# Patient Record
Sex: Female | Born: 1970 | Race: Black or African American | Hispanic: No | Marital: Single | Smoking: Never smoker
Health system: Southern US, Community
[De-identification: ages and names within clinical notes are randomized; demographics above are authoritative.]

## PROBLEM LIST (undated history)

## (undated) DIAGNOSIS — E538 Deficiency of other specified B group vitamins: Secondary | ICD-10-CM

## (undated) DIAGNOSIS — N6320 Unspecified lump in the left breast, unspecified quadrant: Secondary | ICD-10-CM

## (undated) DIAGNOSIS — R51 Headache: Secondary | ICD-10-CM

## (undated) DIAGNOSIS — D649 Anemia, unspecified: Secondary | ICD-10-CM

## (undated) DIAGNOSIS — K5190 Ulcerative colitis, unspecified, without complications: Secondary | ICD-10-CM

## (undated) DIAGNOSIS — R519 Headache, unspecified: Secondary | ICD-10-CM

## (undated) HISTORY — DX: Deficiency of other specified B group vitamins: E53.8

## (undated) HISTORY — DX: Anemia, unspecified: D64.9

## (undated) HISTORY — PX: BREAST LUMPECTOMY: SHX2

---

## 2008-07-03 ENCOUNTER — Ambulatory Visit (HOSPITAL_COMMUNITY): Admission: RE | Admit: 2008-07-03 | Discharge: 2008-07-03 | Payer: Self-pay | Admitting: Family Medicine

## 2009-04-19 ENCOUNTER — Inpatient Hospital Stay (HOSPITAL_COMMUNITY): Admission: AD | Admit: 2009-04-19 | Discharge: 2009-04-20 | Payer: Self-pay | Admitting: Obstetrics & Gynecology

## 2009-07-18 ENCOUNTER — Ambulatory Visit: Payer: Self-pay | Admitting: Oncology

## 2009-07-19 LAB — CBC WITH DIFFERENTIAL/PLATELET
Basophils Absolute: 0 10*3/uL (ref 0.0–0.1)
EOS%: 2.3 % (ref 0.0–7.0)
Eosinophils Absolute: 0.2 10*3/uL (ref 0.0–0.5)
HCT: 22.1 % — ABNORMAL LOW (ref 34.8–46.6)
HGB: 6.6 g/dL — CL (ref 11.6–15.9)
MCH: 21.3 pg — ABNORMAL LOW (ref 25.1–34.0)
MCV: 71.3 fL — ABNORMAL LOW (ref 79.5–101.0)
MONO%: 7.2 % (ref 0.0–14.0)
NEUT%: 75.8 % (ref 38.4–76.8)
lymph#: 1.3 10*3/uL (ref 0.9–3.3)

## 2009-07-23 LAB — IRON AND TIBC
Iron: 17 ug/dL — ABNORMAL LOW (ref 42–145)
TIBC: 432 ug/dL (ref 250–470)
UIBC: 415 ug/dL

## 2009-07-23 LAB — HEMOGLOBINOPATHY EVALUATION
Hemoglobin Other: 0 % (ref 0.0–0.0)
Hgb A2 Quant: 2.2 % (ref 2.2–3.2)

## 2009-07-23 LAB — COMPREHENSIVE METABOLIC PANEL
AST: 12 U/L (ref 0–37)
BUN: 8 mg/dL (ref 6–23)
Calcium: 8.4 mg/dL (ref 8.4–10.5)
Chloride: 106 mEq/L (ref 96–112)
Creatinine, Ser: 0.64 mg/dL (ref 0.40–1.20)
Glucose, Bld: 85 mg/dL (ref 70–99)

## 2009-08-06 ENCOUNTER — Ambulatory Visit (HOSPITAL_COMMUNITY): Admission: AD | Admit: 2009-08-06 | Discharge: 2009-08-06 | Payer: Self-pay | Admitting: Obstetrics and Gynecology

## 2009-08-13 ENCOUNTER — Ambulatory Visit (HOSPITAL_COMMUNITY): Admission: AD | Admit: 2009-08-13 | Discharge: 2009-08-13 | Payer: Self-pay | Admitting: Obstetrics and Gynecology

## 2009-08-20 ENCOUNTER — Ambulatory Visit (HOSPITAL_COMMUNITY): Admission: AD | Admit: 2009-08-20 | Discharge: 2009-08-20 | Payer: Self-pay | Admitting: Obstetrics and Gynecology

## 2009-08-27 ENCOUNTER — Ambulatory Visit (HOSPITAL_COMMUNITY): Admission: AD | Admit: 2009-08-27 | Discharge: 2009-08-27 | Payer: Self-pay | Admitting: Obstetrics and Gynecology

## 2009-10-10 ENCOUNTER — Ambulatory Visit (HOSPITAL_COMMUNITY): Admission: RE | Admit: 2009-10-10 | Discharge: 2009-10-10 | Payer: Self-pay | Admitting: Obstetrics and Gynecology

## 2009-10-14 ENCOUNTER — Ambulatory Visit: Payer: Self-pay | Admitting: Oncology

## 2009-10-16 LAB — CBC WITH DIFFERENTIAL/PLATELET
BASO%: 0.3 % (ref 0.0–2.0)
EOS%: 2.8 % (ref 0.0–7.0)
Eosinophils Absolute: 0.2 10*3/uL (ref 0.0–0.5)
MCHC: 33.5 g/dL (ref 31.5–36.0)
MCV: 83.8 fL (ref 79.5–101.0)
MONO#: 0.5 10*3/uL (ref 0.1–0.9)
MONO%: 7.9 % (ref 0.0–14.0)
Platelets: 257 10*3/uL (ref 145–400)
RBC: 3.65 10*6/uL — ABNORMAL LOW (ref 3.70–5.45)
RDW: 25.7 % — ABNORMAL HIGH (ref 11.2–14.5)
WBC: 6.4 10*3/uL (ref 3.9–10.3)

## 2009-10-16 LAB — IRON AND TIBC
%SAT: 12 % — ABNORMAL LOW (ref 20–55)
UIBC: 412 ug/dL

## 2009-11-01 ENCOUNTER — Inpatient Hospital Stay (HOSPITAL_COMMUNITY): Admission: RE | Admit: 2009-11-01 | Discharge: 2009-11-03 | Payer: Self-pay | Admitting: Obstetrics and Gynecology

## 2009-11-01 ENCOUNTER — Encounter (INDEPENDENT_AMBULATORY_CARE_PROVIDER_SITE_OTHER): Payer: Self-pay | Admitting: Obstetrics and Gynecology

## 2009-11-01 HISTORY — PX: TUBAL LIGATION: SHX77

## 2009-11-01 HISTORY — PX: LYSIS OF ADHESION: SHX5961

## 2009-12-16 ENCOUNTER — Ambulatory Visit: Payer: Self-pay | Admitting: Oncology

## 2009-12-18 LAB — COMPREHENSIVE METABOLIC PANEL
AST: 14 U/L (ref 0–37)
Albumin: 4.3 g/dL (ref 3.5–5.2)
Alkaline Phosphatase: 95 U/L (ref 39–117)
BUN: 13 mg/dL (ref 6–23)
Calcium: 9.8 mg/dL (ref 8.4–10.5)
Creatinine, Ser: 0.68 mg/dL (ref 0.40–1.20)
Potassium: 4.2 mEq/L (ref 3.5–5.3)
Total Bilirubin: 0.4 mg/dL (ref 0.3–1.2)
Total Protein: 7.7 g/dL (ref 6.0–8.3)

## 2009-12-18 LAB — CBC WITH DIFFERENTIAL/PLATELET
BASO%: 0.5 % (ref 0.0–2.0)
Basophils Absolute: 0 10*3/uL (ref 0.0–0.1)
EOS%: 4 % (ref 0.0–7.0)
LYMPH%: 33.1 % (ref 14.0–49.7)
MCH: 27.4 pg (ref 25.1–34.0)
MCHC: 33.1 g/dL (ref 31.5–36.0)
MCV: 82.8 fL (ref 79.5–101.0)
NEUT%: 53.4 % (ref 38.4–76.8)
Platelets: 319 10*3/uL (ref 145–400)
RBC: 4.26 10*6/uL (ref 3.70–5.45)
RDW: 17.2 % — ABNORMAL HIGH (ref 11.2–14.5)
WBC: 4.2 10*3/uL (ref 3.9–10.3)

## 2009-12-18 LAB — IRON AND TIBC
%SAT: 21 % (ref 20–55)
Iron: 75 ug/dL (ref 42–145)

## 2009-12-18 LAB — FERRITIN: Ferritin: 11 ng/mL (ref 10–291)

## 2010-08-21 ENCOUNTER — Emergency Department (HOSPITAL_COMMUNITY)
Admission: EM | Admit: 2010-08-21 | Discharge: 2010-08-21 | Payer: Self-pay | Source: Home / Self Care | Admitting: Emergency Medicine

## 2010-09-24 ENCOUNTER — Encounter (INDEPENDENT_AMBULATORY_CARE_PROVIDER_SITE_OTHER): Payer: Self-pay | Admitting: *Deleted

## 2010-10-09 NOTE — Letter (Signed)
Summary: New Patient letter  [facility]  [address] [place], [place] [address]   Phone: [phone]  Fax: [phone]       09/24/2010 MRN: [id number]  SHARNE LINDERS [address] [place], [place]  [address]  Dear [name],  Welcome to the Gastroenterology Division at [facility].    You are scheduled to see Dr.  Sheryn Bison on October 30, 2010 at 10:00am on the 3rd floor at [facility], [address]  We ask that you try to arrive at our office 15 minutes prior to your appointment time to allow for check-in.  We would like you to complete the enclosed self-administered evaluation form prior to your visit and bring it with you on the day of your appointment.  We will review it with you.  Also, please bring a complete list of all your medications or, if you prefer, bring the medication bottles and we will list them.  Please bring your insurance card so that we may make a copy of it.  If your insurance requires a referral to see a specialist, please bring your referral form from your primary care physician.  Co-payments are due at the time of your visit and may be paid by cash, check or credit card.     Your office visit will consist of a consult with your physician (includes a physical exam), any laboratory testing he/she may order, scheduling of any necessary diagnostic testing (e.g. x-ray, ultrasound, CT-scan), and scheduling of a procedure (e.g. Endoscopy, Colonoscopy) if required.  Please allow enough time on your schedule to allow for any/all of these possibilities.    If you cannot keep your appointment, please call [phone] to cancel or reschedule prior to your appointment date.  This allows [place] the opportunity to schedule an appointment for another patient in need of care.  If you do not cancel or reschedule by 5 p.m. the business day prior to your appointment date, you will be charged a $50.00 late cancellation/no-show fee.    Thank you  for choosing [facility] for your medical needs.  We appreciate the opportunity to care for you.  Please visit [place] at our website  to learn more about our practice.                     Sincerely,                                                             The Gastroenterology Division

## 2010-10-30 ENCOUNTER — Encounter: Payer: Self-pay | Admitting: Gastroenterology

## 2010-10-30 ENCOUNTER — Other Ambulatory Visit: Payer: Medicaid Other

## 2010-10-30 ENCOUNTER — Other Ambulatory Visit: Payer: Self-pay | Admitting: Gastroenterology

## 2010-10-30 ENCOUNTER — Ambulatory Visit (INDEPENDENT_AMBULATORY_CARE_PROVIDER_SITE_OTHER): Payer: Medicaid Other | Admitting: Gastroenterology

## 2010-10-30 ENCOUNTER — Encounter (INDEPENDENT_AMBULATORY_CARE_PROVIDER_SITE_OTHER): Payer: Self-pay | Admitting: *Deleted

## 2010-10-30 DIAGNOSIS — K625 Hemorrhage of anus and rectum: Secondary | ICD-10-CM

## 2010-10-30 DIAGNOSIS — K5150 Left sided colitis without complications: Secondary | ICD-10-CM

## 2010-10-30 DIAGNOSIS — E079 Disorder of thyroid, unspecified: Secondary | ICD-10-CM

## 2010-10-30 LAB — TSH: TSH: 0.28 u[IU]/mL — ABNORMAL LOW (ref 0.35–5.50)

## 2010-10-30 LAB — CBC WITH DIFFERENTIAL/PLATELET
Basophils Relative: 1.1 % (ref 0.0–3.0)
Eosinophils Absolute: 0.2 10*3/uL (ref 0.0–0.7)
Hemoglobin: 12 g/dL (ref 12.0–15.0)
MCHC: 33.8 g/dL (ref 30.0–36.0)
MCV: 90.4 fl (ref 78.0–100.0)
Monocytes Absolute: 0.3 10*3/uL (ref 0.1–1.0)
RBC: 3.91 Mil/uL (ref 3.87–5.11)
RDW: 12.5 % (ref 11.5–14.6)
WBC: 4.7 10*3/uL (ref 4.5–10.5)

## 2010-10-30 LAB — HEPATIC FUNCTION PANEL
ALT: 8 U/L (ref 0–35)
Albumin: 3.9 g/dL (ref 3.5–5.2)
Bilirubin, Direct: 0.1 mg/dL (ref 0.0–0.3)

## 2010-10-30 LAB — BASIC METABOLIC PANEL
BUN: 16 mg/dL (ref 6–23)
CO2: 29 mEq/L (ref 19–32)
Calcium: 9.6 mg/dL (ref 8.4–10.5)
Chloride: 105 mEq/L (ref 96–112)
Glucose, Bld: 127 mg/dL — ABNORMAL HIGH (ref 70–99)
Sodium: 139 mEq/L (ref 135–145)

## 2010-10-30 LAB — IBC PANEL
Saturation Ratios: 21.8 % (ref 20.0–50.0)
Transferrin: 259.2 mg/dL (ref 212.0–360.0)

## 2010-10-30 LAB — VITAMIN B12: Vitamin B-12: 225 pg/mL (ref 211–911)

## 2010-10-30 LAB — HIGH SENSITIVITY CRP: CRP, High Sensitivity: 12.13 mg/L — ABNORMAL HIGH (ref 0.00–5.00)

## 2010-10-31 ENCOUNTER — Encounter (INDEPENDENT_AMBULATORY_CARE_PROVIDER_SITE_OTHER): Payer: Medicaid Other

## 2010-10-31 ENCOUNTER — Encounter: Payer: Self-pay | Admitting: Gastroenterology

## 2010-10-31 DIAGNOSIS — E079 Disorder of thyroid, unspecified: Secondary | ICD-10-CM | POA: Insufficient documentation

## 2010-10-31 DIAGNOSIS — E538 Deficiency of other specified B group vitamins: Secondary | ICD-10-CM

## 2010-10-31 LAB — CONVERTED CEMR LAB: T4, Total: 7.7 ug/dL (ref 5.0–12.5)

## 2010-11-03 ENCOUNTER — Other Ambulatory Visit (AMBULATORY_SURGERY_CENTER): Payer: Medicaid Other | Admitting: Gastroenterology

## 2010-11-03 ENCOUNTER — Other Ambulatory Visit: Payer: Self-pay | Admitting: Gastroenterology

## 2010-11-03 DIAGNOSIS — R109 Unspecified abdominal pain: Secondary | ICD-10-CM

## 2010-11-03 DIAGNOSIS — R197 Diarrhea, unspecified: Secondary | ICD-10-CM

## 2010-11-03 DIAGNOSIS — K5289 Other specified noninfective gastroenteritis and colitis: Secondary | ICD-10-CM

## 2010-11-03 DIAGNOSIS — K625 Hemorrhage of anus and rectum: Secondary | ICD-10-CM

## 2010-11-04 ENCOUNTER — Encounter: Payer: Self-pay | Admitting: Gastroenterology

## 2010-11-04 NOTE — Assessment & Plan Note (Signed)
Summary: 1 of 3 b12 injections/lk  Nurse Visit   Allergies: No Known Drug Allergies  Medication Administration  Injection # 1:    Medication: Vit B12 1000 mcg    Diagnosis: VITAMIN B12 DEFICIENCY (ICD-266.2)    Route: IM    Site: L deltoid    Exp Date: 07/2012    Lot #: [id number]    Mfr: American Regent    Patient tolerated injection without complications    Given by: Merri Ray CMA Duncan Dull) (October 31, 2010 2:29 PM)  Orders Added: 1)  Vit B12 1000 mcg [J3420]

## 2010-11-04 NOTE — Assessment & Plan Note (Signed)
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 Pecola Leisure for evaluation of several years of lower abdominal cramping and bloody diarrhea. She has a 11-month-old infant, and is breast-feeding at this time, and had a menstrual period approximately 2-3 weeks ago. She denies upper gastrointestinal or hepatobiliary complaints. She has not had previous colonoscopy or barium studies.  There is no family history of inflammatory bowel disease, and the patient denies infectious disease exposure or foreign travel. She denies any specific food intolerances except to meats. There is no history of fever, chills, skin rashes, joint pains, or oral stomatitis. She recently was found to have a guaiac positive stool but was not anemic. There is no history of use of alcohol, cigarettes, or NSAIDs.   GI Review of Systems    Reports abdominal pain, bloating, nausea, and  vomiting.     Location of  Abdominal pain: lower abdomen.    Denies acid reflux, belching, chest pain, dysphagia with liquids, dysphagia with solids, heartburn, loss of appetite, vomiting blood, weight loss, and  weight gain.      Reports change in bowel habits, diarrhea, hemorrhoids, irritable bowel syndrome, rectal bleeding, and  rectal pain.     Denies anal fissure, black tarry stools, constipation, diverticulosis, fecal incontinence, heme positive stool, jaundice, light color stool, and  liver problems. Preventive Screening-Counseling & Management  Alcohol-Tobacco     Smoking Status: never      Drug Use:  no.      Current Medications (verified): 1)  None  Allergies (verified): No  Known Drug Allergies  Past History:  Past medical, surgical, family and social histories (including risk factors) reviewed for relevance to current acute and chronic problems.  Past Medical History: Anemia Irritable Bowel Syndrome 2009 Pneumonia 2010  Past Surgical History: C- section  x3  Family History: Reviewed history and no changes required. No FH of Colon Cancer:  Social History: Reviewed history and no changes required. 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  Alcohol Use - no Illicit Drug Use - no Smoking Status:  never Drug Use:  no  Review of Systems       The patient complains of headaches-new.  The patient denies allergy/sinus, anemia, anxiety-new, arthritis/joint pain, back pain, blood in urine, breast changes/lumps, change in vision, confusion, cough, coughing up blood, depression-new, fainting, fatigue, fever, hearing problems, heart murmur, heart rhythm changes, itching, menstrual pain, muscle pains/cramps, night sweats, nosebleeds, pregnancy symptoms, shortness of breath, skin rash, sleeping problems, sore throat, swelling of feet/legs, swollen lymph glands, thirst - excessive , urination - excessive , urination changes/pain, urine leakage, vision changes, and voice change.         She does take Excedrin Migraine tablets p.r.n.  Vital Signs:  Patient profile:   40 year old female Height:      63 inches Weight:      132.38 pounds BMI:     23.53 Pulse rate:   64 / minute Pulse rhythm:   regular BP sitting:   86 / 60  (left arm) Cuff size:   regular  Vitals Entered By: June McMurray CMA Duncan Dull) (October 30, 2010 10:05 AM)  Physical Exam  General:  Well developed, well nourished, no acute distress.healthy appearing.   Head:  Normocephalic and atraumatic. Eyes:  PERRLA, no icterus.exam deferred to patient's ophthalmologist.   Neck:  Supple; no masses or thyromegaly. Lungs:  Clear throughout to auscultation. Heart:  Regular rate and rhythm; no  murmurs, rubs,  or bruits. Abdomen:  Soft, nontender and nondistended. No masses, hepatosplenomegaly or hernias noted. Normal bowel sounds. Rectal:  very loose yellowish stool which is markedly guaiac positive. Msk:  Symmetrical with no gross deformities. Normal posture. Pulses:  Normal pulses noted. Extremities:  No clubbing, cyanosis, edema or deformities noted. Neurologic:  Alert and  oriented x4;  grossly normal neurologically. Cervical Nodes:  No significant cervical adenopathy. Psych:  Alert and cooperative. Normal mood and affect.   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 Labs are been ordered also stool exams to exclude enteric pathogens. I have started her on Lialda 2.4 g twice a day as tolerated with a low fiber diet. Colonoscopy also scheduled at her convenience. I have advised her that she will need to suspend breast-feeding for at least 12 hours after her colonoscopy because of the narcotics and benzodiazepine we used for sedation. Orders: TLB-CBC Platelet - w/Differential (85025-CBCD) TLB-BMP (Basic Metabolic Panel-BMET) (80048-METABOL) TLB-Hepatic/Liver Function Pnl (80076-HEPATIC) TLB-TSH (Thyroid Stimulating Hormone) (84443-TSH) TLB-Ferritin (82728-FER) TLB-B12, Serum-Total ONLY (27253-G64) TLB-Folic Acid (Folate) (82746-FOL) TLB-IBC Pnl (Iron/FE;Transferrin) (83550-IBC) TLB-CRP-High Sensitivity (C-Reactive Protein) (86140-FCRP) TLB-Sedimentation Rate (ESR) (85652-ESR) T-Culture, Stool (87045/87046-70140) T-Culture, C-Diff Toxin A/B (40347-42595) T-Stool Giardia / Crypto- EIA (63875) T-Stool for O&P (64332-95188) T-Fecal WBC (41660-63016) Colonoscopy (Colon)  Problem # 2:  RECTAL BLEEDING (ICD-569.3) Assessment: Unchanged  Orders: TLB-CBC Platelet - w/Differential (85025-CBCD) TLB-BMP (Basic Metabolic Panel-BMET) (80048-METABOL) TLB-Hepatic/Liver  Function Pnl (80076-HEPATIC) TLB-TSH (Thyroid Stimulating Hormone) (84443-TSH) TLB-Ferritin (82728-FER) TLB-B12, Serum-Total ONLY (01093-A35) TLB-Folic Acid (Folate) (82746-FOL) TLB-IBC Pnl (Iron/FE;Transferrin) (83550-IBC) TLB-CRP-High Sensitivity (C-Reactive Protein) (86140-FCRP) TLB-Sedimentation Rate (ESR) (85652-ESR) T-Culture, Stool (87045/87046-70140) T-Culture, C-Diff Toxin A/B (57322-02542) T-Stool Giardia / Crypto- EIA (70623) T-Stool for O&P (76283-15176) T-Fecal WBC (16073-71062) Colonoscopy (Colon)  Patient Instructions: 1)  Copy sent to : Leilani Able, MD 2)  Advised to stick with a low residue diet  avoiding food that can irritate bowel (see handout).  3)  Please go to the basement today for your labs.  4)  Your procedure has been scheduled for 11/03/2010, please follow the seperate instructions.  5)  [facility] Patient Information Guide given to patient.  6)  Colonoscopy and Flexible Sigmoidoscopy brochure given.  7)  The medication list was reviewed and reconciled.  All changed / newly prescribed medications were explained.  A complete medication list was provided to the patient / caregiver. Prescriptions: MOVIPREP 100 GM  SOLR (PEG-KCL-NACL-NASULF-NA ASC-C) As per prep instructions.  #1 x 0   Entered by:   Harlow Mares CMA (AAMA)   Authorized by:   Mardella Layman MD [facility]   Signed by:   Harlow Mares CMA (AAMA) on 10/30/2010   Method used:   Electronically to        [facility] [phone]* (retail)       [address]       [place], [place]  [address]       Ph: [phone] or [phone]       Fax: [phone]   RxID:   [id number] LIALDA 1.2 GM TBEC (MESALAMINE) take two by mouth two times a day  #120 x 1   Entered  by:   Harlow Mares CMA (AAMA)   Authorized by:   Mardella Layman MD [facility]   Signed by:   Harlow Mares CMA (AAMA) on 10/30/2010   Method used:   [facility]ron[facility] to        [facility] [phone]* (retail)       [address]        [place], [place]  [address]       Ph: [phone] or [phone]       Fax: [phone]   RxID:   [id number]

## 2010-11-04 NOTE — Letter (Signed)
Summary: [facility] Instructions  [facility]  [address] [place], [place] [address]   Phone: [phone]  Fax: [phone]       Kim Hurst    1970/11/19    MRN: [id number]        Procedure Day /Date: Monday 11/03/2010     Arrival Time: 3pm     Procedure Time: 4pm     Location of Procedure:                    [facility] (4th Floor)   PREPARATION FOR COLONOSCOPY WITH MOVIPREP   Starting 5 days prior to your procedure TODAY do not eat nuts, seeds, popcorn, corn, beans, peas,  salads, or any raw vegetables.  Do not take any fiber supplements (e.g. Metamucil, Citrucel, and Benefiber).  THE DAY BEFORE YOUR PROCEDURE        Sunday 11/02/2010  1.  Drink clear liquids the entire day-NO SOLID FOOD  2.  Do not drink anything colored red or purple.  Avoid juices with pulp.  No orange juice.  3.  Drink at least 64 oz. (8 glasses) of fluid/clear liquids during the day to prevent dehydration and help the prep work efficiently.  CLEAR LIQUIDS INCLUDE: Water Jello Ice Popsicles Tea (sugar ok, no milk/cream) Powdered fruit flavored drinks Coffee (sugar ok, no milk/cream) Gatorade Juice: apple, white grape, white cranberry  Lemonade Clear bullion, consomm, broth Carbonated beverages (any kind) Strained chicken noodle soup Hard Candy                             4.  In the morning, mix first dose of MoviPrep solution:    Empty 1 Pouch A and 1 Pouch B into the disposable container    Add lukewarm drinking water to the top line of the container. Mix to dissolve    Refrigerate (mixed solution should be used within 24 hrs)  5.  Begin drinking the prep at 5:00 p.m. The MoviPrep container is divided by 4 marks.   Every 15 minutes drink the solution down to the next mark (approximately 8 oz) until the full liter is complete.   6.  Follow completed prep with 16 oz of clear liquid of your choice (Nothing red or purple).  Continue to drink clear  liquids until bedtime.  7.  Before going to bed, mix second dose of MoviPrep solution:    Empty 1 Pouch A and 1 Pouch B into the disposable container    Add lukewarm drinking water to the top line of the container. Mix to dissolve    Refrigerate  THE DAY OF YOUR PROCEDURE      Monday 11/03/2010  Beginning at 11:00am (5 hours before procedure):         1. Every 15 minutes, drink the solution down to the next mark (approx 8 oz) until the full liter is complete.  2. Follow completed prep with 16 oz. of clear liquid of your choice.    3. You may drink clear liquids until 2:00pm (2 HOURS BEFORE PROCEDURE).   MEDICATION INSTRUCTIONS  Unless otherwise instructed, you should take regular prescription medications with a small sip of water   as early as possible the morning of your procedure.           OTHER INSTRUCTIONS  You will need a responsible adult at least 42 years of age to accompany you and drive  you home.   This person must remain in the waiting room during your procedure.  Wear loose fitting clothing that is easily removed.  Leave jewelry and other valuables at home.  However, you may wish to bring a book to read or  an iPod/MP3 player to listen to music as you wait for your procedure to start.  Remove all body piercing jewelry and leave at home.  Total time from sign-in until discharge is approximately 2-3 hours.  You should go home directly after your procedure and rest.  You can resume normal activities the  day after your procedure.  The day of your procedure you should not:   Drive   Make legal decisions   Operate machinery   Drink alcohol   Return to work  You will receive specific instructions about eating, activities and medications before you leave.    The above instructions have been reviewed and explained to me by   _______________________    I fully understand and can verbalize these instructions _____________________________ Date  _________

## 2010-11-07 ENCOUNTER — Encounter (INDEPENDENT_AMBULATORY_CARE_PROVIDER_SITE_OTHER): Payer: Medicaid Other

## 2010-11-07 ENCOUNTER — Encounter: Payer: Self-pay | Admitting: Gastroenterology

## 2010-11-07 DIAGNOSIS — E538 Deficiency of other specified B group vitamins: Secondary | ICD-10-CM

## 2010-11-11 ENCOUNTER — Encounter: Payer: Self-pay | Admitting: Gastroenterology

## 2010-11-13 ENCOUNTER — Encounter: Payer: Self-pay | Admitting: Gastroenterology

## 2010-11-13 NOTE — Assessment & Plan Note (Signed)
Summary: B12 INJECTIONSCH'D W/PT  Nurse Visit   Allergies: No Known Drug Allergies  Medication Administration  Injection # 1:    Medication: Vit B12 1000 mcg    Diagnosis: VITAMIN B12 DEFICIENCY (ICD-266.2)    Route: IM    Site: L deltoid    Exp Date: 11/13    Lot #: [id number]    Mfr: American Regent    Patient tolerated injection without complications    Given by: Lamona Curl CMA (AAMA) (November 07, 2010 2:13 PM)  Orders Added: 1)  Vit B12 1000 mcg [J3420]

## 2010-11-13 NOTE — Procedures (Addendum)
Summary: Colonoscopy  Patient: Kim Hurst Note: All result statuses are Final unless otherwise noted.  Tests: (1) Colonoscopy (COL)   COL Colonoscopy           DONE     [facility]     [address]     [place], [place]  [address]          COLONOSCOPY PROCEDURE REPORT          PATIENT:  Kim Hurst, Kim Hurst  MR#:  [id number]     BIRTHDATE:  February 13, 1971, 40 yrs. old  GENDER:  female     ENDOSCOPIST:  Vania Rea. Jarold Motto, MD, [facility]     REF. BY:  Leilani Able, M.D.     PROCEDURE DATE:  11/03/2010     PROCEDURE:  Colonoscopy with biopsy     ASA CLASS:  Class I     INDICATIONS:  Abdominal pain, unexplained diarrhea, rectal     bleeding     MEDICATIONS:   Fentanyl 100 mcg IV, Versed 10 mg IV          DESCRIPTION OF PROCEDURE:   After the risks benefits and     alternatives of the procedure were thoroughly explained, informed     consent was obtained.  Digital rectal exam was performed and     revealed tender.   The LB 180AL [id number] endoscope was introduced     through the anus and advanced to the cecum, which was identified     by both the appendix and ileocecal valve, without limitations.     The quality of the prep was excellent, using MoviPrep.  The     instrument was then slowly withdrawn as the colon was fully     examined.<<PROCEDUREIMAGES>>          FINDINGS:  Colitis was found in the rectum and sigmoid colon. 0-15     CM GRANULAR AND ERODED MUCOSA BIOPSIED.SEE PICTURES  This was     otherwise a normal examination of the colon.   Retroflexed views     in the rectum revealed it was not tolerated by the patient.    The     scope was then withdrawn from the patient and the procedure     completed.          COMPLICATIONS:  None     ENDOSCOPIC IMPRESSION:     1) Colitis in the rectum and sigmoid colon     2) Otherwise normal examination     PROCTOSIGMOIDITIS.ALL STOOL EXAMS AND C.DIFF TOXIN ASSAY ARE     NEGATIVE.     RECOMMENDATIONS:     1.CONTINUE LIALDA 2.4 GM  DAY     2.CANASA 1 GM SUPP QHS     3.OV 1 MONTH     REPEAT EXAM:  No          ______________________________     Vania Rea. Jarold Motto, MD, [facility]          CC:          n.     eSIGNED:   Vania Rea. Robyn Nohr at 11/03/2010 05:10 PM          Daryll Drown, [id number]  Note: An exclamation mark (!) indicates a result that was not dispersed into the flowsheet. Document Creation Date: 11/03/2010 5:10 PM _______________________________________________________________________  (1) Order result status: Final Collection or observation date-time: 11/03/2010 17:01 Requested date-time:  Receipt date-time:  Reported date-time:  Referring Physician:   Ordering Physician: Sheryn Bison (  [id number]) Specimen Source:  Source: Launa Grill Order Number: [id number] Lab site:

## 2010-11-13 NOTE — Letter (Signed)
Summary: Appt Reminder 2  [facility]  [address] [place], [place] [address]   Phone: [phone]  Fax: [phone]        November 04, 2010 MRN: [id number]    LENNY BOUCHILLON [address] [place], [place]  [address]    Dear Ms. DOKE,   You have a return appointment with Dr.Patterson on March 27,2012 at 2:45pm.  Please remember to bring a complete list of the medicines you are taking, your insurance card and your co-pay.  If you have to cancel or reschedule this appointment, please call before 5:00 pm the evening before to avoid a cancellation fee.  If you have any questions or concerns, please call [phone].    Sincerely,    Graciella Freer RN

## 2010-11-14 ENCOUNTER — Encounter: Payer: Self-pay | Admitting: Gastroenterology

## 2010-11-14 ENCOUNTER — Encounter (INDEPENDENT_AMBULATORY_CARE_PROVIDER_SITE_OTHER): Payer: Medicaid Other

## 2010-11-14 DIAGNOSIS — E538 Deficiency of other specified B group vitamins: Secondary | ICD-10-CM

## 2010-11-17 LAB — URINE MICROSCOPIC-ADD ON

## 2010-11-17 LAB — URINALYSIS, ROUTINE W REFLEX MICROSCOPIC
Bilirubin Urine: NEGATIVE
Glucose, UA: NEGATIVE mg/dL
Leukocytes, UA: NEGATIVE
Protein, ur: 30 mg/dL — AB
Specific Gravity, Urine: 1.041 — ABNORMAL HIGH (ref 1.005–1.030)
Urobilinogen, UA: 0.2 mg/dL (ref 0.0–1.0)
pH: 6 (ref 5.0–8.0)

## 2010-11-17 LAB — DIFFERENTIAL
Basophils Absolute: 0 10*3/uL (ref 0.0–0.1)
Basophils Relative: 1 % (ref 0–1)
Eosinophils Absolute: 0.1 10*3/uL (ref 0.0–0.7)
Eosinophils Relative: 2 % (ref 0–5)
Monocytes Absolute: 0.8 10*3/uL (ref 0.1–1.0)
Monocytes Relative: 15 % — ABNORMAL HIGH (ref 3–12)
Neutrophils Relative %: 56 % (ref 43–77)

## 2010-11-17 LAB — COMPREHENSIVE METABOLIC PANEL
ALT: 17 U/L (ref 0–35)
Albumin: 4.3 g/dL (ref 3.5–5.2)
Calcium: 9.6 mg/dL (ref 8.4–10.5)
Chloride: 102 mEq/L (ref 96–112)
GFR calc Af Amer: >60 mL/min (ref >60)
Potassium: 3.7 mEq/L (ref 3.5–5.1)
Total Protein: 8.7 g/dL — ABNORMAL HIGH (ref 6.0–8.3)

## 2010-11-17 LAB — PROTIME-INR: INR: 0.97 (ref 0.00–1.49)

## 2010-11-17 LAB — CBC
MCHC: 33.3 g/dL (ref 30.0–36.0)
Platelets: 362 10*3/uL (ref 150–400)
RDW: 12.6 % (ref 11.5–15.5)
WBC: 5.1 10*3/uL (ref 4.0–10.5)

## 2010-11-17 LAB — TYPE AND SCREEN
ABO/RH(D): O POS
Antibody Screen: NEGATIVE

## 2010-11-18 NOTE — Miscellaneous (Signed)
Summary: Change drugs  Changed Lialda Azulfidine due to insurance  Clinical Lists Changes  Medications: Changed medication from LIALDA 1.2 GM TBEC (MESALAMINE) take two by mouth two times a day to AZULFIDINE 500 MG TABS (SULFASALAZINE) take two tabs by mouth two times a day - Signed Rx of AZULFIDINE 500 MG TABS (SULFASALAZINE) take two tabs by mouth two times a day;  #120 x 6;  Signed;  Entered by: Harlow Mares CMA (AAMA);  Authorized by: Mardella Layman MD [facility];  Method used: [facility] [phone]*, [address], [place], [place]  [address], Ph: [phone] or [phone], Fax: [phone]    Prescriptions: AZULFIDINE 500 MG TABS (SULFASALAZINE) take two tabs by mouth two times a day  #120 x 6   Entered by:   Harlow Mares CMA (AAMA)   Authorized by:   Mardella Layman MD [facility]   Signed by:   Harlow Mares CMA (AAMA) on 11/13/2010   Method used:   [facility]ron[facility] to        [facility] [phone]* (retail)       [address]       [place], [place]  [address]       Ph: [phone] or [phone]       Fax: [phone]   RxID:   [id number]

## 2010-11-18 NOTE — Letter (Signed)
Summary: Patient Notice- Colon Biospy Results  [facility]  [address] [place], [place] [address]   Phone: [phone]  Fax: [phone]        November 11, 2010 MRN: [id number]    Kim Hurst [address] [place], [place]  [address]    Dear [name],  I am pleased to inform you that the biopsies taken during your recent colonoscopy did not show any evidence of cancer upon pathologic examination.  Additional information/recommendations:  __No further action is needed at this time.  Please follow-up with      your primary care physician for your other healthcare needs.  __Please call [phone] to schedule a return visit to review      your condition.  xx__Continue with the treatment plan as outlined on the day of your      exam.  __You should have a repeat colonoscopy examination for this problem           in _ years.  Please call us if you are having persistent problems or have questions about your condition that have not been fully answered at this time.  Sincerely,  Mardella Layman MD [facility]   This letter has been electronically signed by your physician.

## 2010-11-18 NOTE — Assessment & Plan Note (Signed)
Summary: b12 shot  Nurse Visit   Allergies: No Known Drug Allergies  Medication Administration  Injection # 1:    Medication: Vit B12 1000 mcg    Diagnosis: VITAMIN B12 DEFICIENCY (ICD-266.2)    Route: IM    Site: L deltoid    Exp Date: 07/2012    Lot #: [id number]    Mfr: American Regent    Patient tolerated injection without complications    Given by: Harlow Mares CMA (AAMA) (November 14, 2010 1:54 PM)  Orders Added: 1)  Vit B12 1000 mcg [J3420]

## 2010-11-26 LAB — CCBB MATERNAL DONOR DRAW

## 2010-11-26 LAB — CBC
HCT: 28.8 % — ABNORMAL LOW (ref 36.0–46.0)
MCHC: 33.1 g/dL (ref 30.0–36.0)
MCV: 83.7 fL (ref 78.0–100.0)
Platelets: 223 10*3/uL (ref 150–400)
Platelets: 264 10*3/uL (ref 150–400)
RBC: 3.44 MIL/uL — ABNORMAL LOW (ref 3.87–5.11)
WBC: 6 10*3/uL (ref 4.0–10.5)
WBC: 8.6 10*3/uL (ref 4.0–10.5)

## 2010-11-26 LAB — URINALYSIS, ROUTINE W REFLEX MICROSCOPIC
Glucose, UA: NEGATIVE mg/dL
Nitrite: NEGATIVE
Protein, ur: NEGATIVE mg/dL
Specific Gravity, Urine: 1.025 (ref 1.005–1.030)
Urobilinogen, UA: 0.2 mg/dL (ref 0.0–1.0)

## 2010-11-26 LAB — HEMOGLOBIN AND HEMATOCRIT, BLOOD
HCT: 22.2 % — ABNORMAL LOW (ref 36.0–46.0)
Hemoglobin: 7.3 g/dL — ABNORMAL LOW (ref 12.0–15.0)

## 2010-11-26 LAB — RPR: RPR Ser Ql: NONREACTIVE

## 2010-12-02 ENCOUNTER — Encounter: Payer: Self-pay | Admitting: Gastroenterology

## 2010-12-02 ENCOUNTER — Ambulatory Visit (INDEPENDENT_AMBULATORY_CARE_PROVIDER_SITE_OTHER): Payer: Medicaid Other | Admitting: Gastroenterology

## 2010-12-02 ENCOUNTER — Other Ambulatory Visit (INDEPENDENT_AMBULATORY_CARE_PROVIDER_SITE_OTHER): Payer: Medicaid Other

## 2010-12-02 VITALS — BP 100/62 | HR 72 | Wt 135.8 lb

## 2010-12-02 DIAGNOSIS — K5150 Left sided colitis without complications: Secondary | ICD-10-CM

## 2010-12-02 DIAGNOSIS — E538 Deficiency of other specified B group vitamins: Secondary | ICD-10-CM

## 2010-12-02 DIAGNOSIS — K625 Hemorrhage of anus and rectum: Secondary | ICD-10-CM

## 2010-12-02 LAB — TSH: TSH: 0.32 u[IU]/mL — ABNORMAL LOW (ref 0.35–5.50)

## 2010-12-02 NOTE — Assessment & Plan Note (Signed)
We will continue B12 replacement therapy as per standard protocol.

## 2010-12-02 NOTE — Patient Instructions (Signed)
Please go to the basement today for your labs.   

## 2010-12-02 NOTE — Progress Notes (Deleted)
This is a    Pertinent Review of Systems Negative   Physical Exam:    Assessment and Plan:

## 2010-12-02 NOTE — Progress Notes (Signed)
This is a  40 year old African American a female who had diarrhea , rectal bleeding, colonoscopy with confirmed left-sided ulcerative colitis. He was breast-feeding her infant and took 3 weeks of Azulfidine 1 g twice a day this self discontinued this medication, and is currently asymptomatic. Review of her labs showed normal labs except for slightly depressed TSH level   Pertinent Review of Systems Negative .Marland Kitchen No symptoms consistent with hyperthyroidism. Nausea upper gastrointestinal or hepatobiliary complaints.       Assessment and Plan: left-sided ulcerative colitis with negative exam for C. Difficile and other pathogens. Currently is asymptomatic and resolved therapy. She is to call if she has relapse of her problems and we will reinstitute aminosalicylate therapy as tolerated. It is of note that the patient continues to breast-feed her infant child. Complete set of thyroid function test ordered today.

## 2010-12-02 NOTE — Assessment & Plan Note (Signed)
This is completely resolved with therapy.

## 2010-12-03 ENCOUNTER — Telehealth: Payer: Self-pay | Admitting: *Deleted

## 2010-12-03 NOTE — Telephone Encounter (Signed)
Message copied by Harlow Mares on Wed Dec 03, 2010  8:39 AM ------      Message from: Jarold Motto, DAVID      Created: Wed Dec 03, 2010  8:25 AM       Thyroid normal

## 2010-12-03 NOTE — Telephone Encounter (Signed)
Left a message on patients machine to call back if she has questions all labs are normal.

## 2010-12-14 LAB — URINALYSIS, ROUTINE W REFLEX MICROSCOPIC
Glucose, UA: NEGATIVE mg/dL
Hgb urine dipstick: NEGATIVE
Ketones, ur: NEGATIVE mg/dL
Protein, ur: NEGATIVE mg/dL

## 2010-12-15 ENCOUNTER — Ambulatory Visit (INDEPENDENT_AMBULATORY_CARE_PROVIDER_SITE_OTHER): Payer: Medicaid Other | Admitting: Gastroenterology

## 2010-12-15 DIAGNOSIS — E538 Deficiency of other specified B group vitamins: Secondary | ICD-10-CM

## 2010-12-15 MED ORDER — CYANOCOBALAMIN 1000 MCG/ML IJ SOLN
1000.0000 ug | INTRAMUSCULAR | Status: DC
  Administered 2010-12-15 – 2011-02-16 (×3): 1000 ug via INTRAMUSCULAR

## 2011-01-15 ENCOUNTER — Ambulatory Visit (INDEPENDENT_AMBULATORY_CARE_PROVIDER_SITE_OTHER): Payer: Medicaid Other | Admitting: Gastroenterology

## 2011-01-15 DIAGNOSIS — E538 Deficiency of other specified B group vitamins: Secondary | ICD-10-CM

## 2011-02-16 ENCOUNTER — Ambulatory Visit (INDEPENDENT_AMBULATORY_CARE_PROVIDER_SITE_OTHER): Payer: Medicaid Other | Admitting: Gastroenterology

## 2011-02-16 DIAGNOSIS — E538 Deficiency of other specified B group vitamins: Secondary | ICD-10-CM

## 2011-03-04 ENCOUNTER — Emergency Department (HOSPITAL_COMMUNITY)
Admission: EM | Admit: 2011-03-04 | Discharge: 2011-03-04 | Disposition: A | Discharge status: Home / Self Care | Payer: Medicaid Other | Attending: Emergency Medicine | Admitting: Emergency Medicine

## 2011-03-04 DIAGNOSIS — T7840XA Allergy, unspecified, initial encounter: Secondary | ICD-10-CM | POA: Insufficient documentation

## 2011-03-04 DIAGNOSIS — X58XXXA Exposure to other specified factors, initial encounter: Secondary | ICD-10-CM | POA: Insufficient documentation

## 2011-03-04 DIAGNOSIS — R21 Rash and other nonspecific skin eruption: Secondary | ICD-10-CM | POA: Insufficient documentation

## 2011-06-09 ENCOUNTER — Other Ambulatory Visit (HOSPITAL_COMMUNITY): Payer: Self-pay | Admitting: Family Medicine

## 2011-06-09 DIAGNOSIS — Z1231 Encounter for screening mammogram for malignant neoplasm of breast: Secondary | ICD-10-CM

## 2011-06-19 ENCOUNTER — Ambulatory Visit (HOSPITAL_COMMUNITY)
Admission: RE | Admit: 2011-06-19 | Discharge: 2011-06-19 | Disposition: A | Discharge status: Home / Self Care | Payer: Medicaid Other | Source: Ambulatory Visit | Attending: Family Medicine | Admitting: Family Medicine

## 2011-06-19 DIAGNOSIS — Z1231 Encounter for screening mammogram for malignant neoplasm of breast: Secondary | ICD-10-CM

## 2011-10-29 ENCOUNTER — Other Ambulatory Visit (HOSPITAL_COMMUNITY): Payer: Self-pay | Admitting: Family Medicine

## 2011-10-29 DIAGNOSIS — Z1231 Encounter for screening mammogram for malignant neoplasm of breast: Secondary | ICD-10-CM

## 2011-11-03 ENCOUNTER — Ambulatory Visit (HOSPITAL_COMMUNITY)
Admission: RE | Admit: 2011-11-03 | Discharge: 2011-11-03 | Disposition: A | Discharge status: Home / Self Care | Payer: Medicaid Other | Source: Ambulatory Visit | Attending: Family Medicine | Admitting: Family Medicine

## 2011-11-03 DIAGNOSIS — Z1231 Encounter for screening mammogram for malignant neoplasm of breast: Secondary | ICD-10-CM | POA: Insufficient documentation

## 2012-06-17 ENCOUNTER — Ambulatory Visit (INDEPENDENT_AMBULATORY_CARE_PROVIDER_SITE_OTHER): Payer: Medicaid Other | Admitting: Gastroenterology

## 2012-06-17 ENCOUNTER — Other Ambulatory Visit (INDEPENDENT_AMBULATORY_CARE_PROVIDER_SITE_OTHER): Payer: Medicaid Other

## 2012-06-17 ENCOUNTER — Encounter: Payer: Self-pay | Admitting: Gastroenterology

## 2012-06-17 VITALS — BP 110/62 | HR 88 | Ht 63.0 in | Wt 119.0 lb

## 2012-06-17 DIAGNOSIS — R109 Unspecified abdominal pain: Secondary | ICD-10-CM

## 2012-06-17 DIAGNOSIS — Z882 Allergy status to sulfonamides status: Secondary | ICD-10-CM

## 2012-06-17 DIAGNOSIS — T370X5A Adverse effect of sulfonamides, initial encounter: Secondary | ICD-10-CM

## 2012-06-17 DIAGNOSIS — R634 Abnormal weight loss: Secondary | ICD-10-CM

## 2012-06-17 DIAGNOSIS — K5289 Other specified noninfective gastroenteritis and colitis: Secondary | ICD-10-CM

## 2012-06-17 DIAGNOSIS — K529 Noninfective gastroenteritis and colitis, unspecified: Secondary | ICD-10-CM

## 2012-06-17 DIAGNOSIS — Z888 Allergy status to other drugs, medicaments and biological substances status: Secondary | ICD-10-CM

## 2012-06-17 LAB — CBC WITH DIFFERENTIAL/PLATELET
Basophils Absolute: 0 10*3/uL (ref 0.0–0.1)
Eosinophils Absolute: 0.2 10*3/uL (ref 0.0–0.7)
Lymphocytes Relative: 36 % (ref 12.0–46.0)
MCHC: 31.6 g/dL (ref 30.0–36.0)
MCV: 74.9 fl — ABNORMAL LOW (ref 78.0–100.0)
Monocytes Absolute: 0.3 10*3/uL (ref 0.1–1.0)
Neutrophils Relative %: 52.1 % (ref 43.0–77.0)
Platelets: 393 10*3/uL (ref 150.0–400.0)
RDW: 20.4 % — ABNORMAL HIGH (ref 11.5–14.6)

## 2012-06-17 LAB — HEPATIC FUNCTION PANEL
ALT: 14 U/L (ref 0–35)
AST: 16 U/L (ref 0–37)
Bilirubin, Direct: 0.1 mg/dL (ref 0.0–0.3)
Total Bilirubin: 0.5 mg/dL (ref 0.3–1.2)
Total Protein: 8.5 g/dL — ABNORMAL HIGH (ref 6.0–8.3)

## 2012-06-17 LAB — FERRITIN: Ferritin: 3.4 ng/mL — ABNORMAL LOW (ref 10.0–291.0)

## 2012-06-17 LAB — BASIC METABOLIC PANEL
CO2: 23 mEq/L (ref 19–32)
Chloride: 108 mEq/L (ref 96–112)
Creatinine, Ser: 0.9 mg/dL (ref 0.4–1.2)
Potassium: 3.9 mEq/L (ref 3.5–5.1)

## 2012-06-17 LAB — SEDIMENTATION RATE: Sed Rate: 49 mm/hr — ABNORMAL HIGH (ref 0–22)

## 2012-06-17 LAB — IBC PANEL
Iron: 39 ug/dL — ABNORMAL LOW (ref 42–145)
Saturation Ratios: 8.1 % — ABNORMAL LOW (ref 20.0–50.0)

## 2012-06-17 LAB — VITAMIN B12: Vitamin B-12: 171 pg/mL — ABNORMAL LOW (ref 211–911)

## 2012-06-17 MED ORDER — MESALAMINE 1.2 G PO TBEC: 2400.0000 mg | DELAYED_RELEASE_TABLET | ORAL | Status: DC

## 2012-06-17 NOTE — Patient Instructions (Addendum)
Go to the basement today for labs Follow up in one month We have sent Lialda to your pharmacy

## 2012-06-17 NOTE — Progress Notes (Addendum)
This is a puzzling 41 year old African American female who has documented left sided ulcerative colitis, now referred from primary care for evaluation of a 20 pound weight loss of unexplained etiology. The patient in the past has responded to by mouth Lialda medication, but apparently over the last year has been on intermittent Azulfidine therapy with associated allergic reactions consisting of skin rashes treated with by mouth prednisone. She currently denies lower abdominal cramping, diarrhea, rectal bleeding, or any systemic complaints. There is no history of upper GI or hepatobiliary symptomatology, fever, chills, or specific food insensitivities. Family history is noncontributory. Patient does have B12 deficiency, but has not been on parenteral replacement.  Current Medications, Allergies, Past Medical History, Past Surgical History, Family History and Social History were reviewed in [employer] record.  Pertinent Review of Systems Negative   Physical Exam: Healthy-appearing patient who appears younger than her stated age. She is in no acute distress. Blood pressure 110/62, pulse 80 and regular, and weight  119 pounds with a BMI of 21.08. I cannot appreciate stigmata of chronic liver disease. Chest is clear and she is in a regular rhythm without murmurs gallops or rubs. There is no hepatosplenomegaly, abdominal masses, tenderness, or distention. Bowel sounds are normal. Inspection of rectum is unremarkable as is rectal exam. There is formed stool present which is guaiac negative. Peripheral extremities unremarkable without edema, phlebitis, or swollen joints. Mental status is normal.    Assessment and Plan: Puzzling case of her relatively healthy patient who in the past has had left-sided colitis with elevated CRP level in February. She currently denies IBD symptomatology, malabsorption symptoms and has no evidence of metabolic disorder. I ordered labs including repeat CRP,  anemia profile, and have empirically started Lialda 2.4 g a day as tolerated with office followup in one month's time. She may need followup colonoscopy from previous exams. She does have a chronic sulfa allergy.    Please copy her primary care physician, referring physician, and pertinent subspecialists. Encounter Diagnoses  Name Primary?  . Colitis Yes  . Abdominal  pain, other specified site

## 2012-06-20 ENCOUNTER — Telehealth: Payer: Self-pay | Admitting: *Deleted

## 2012-06-20 LAB — TISSUE TRANSGLUTAMINASE, IGA: Tissue Transglutaminase Ab, IgA: 5.5 U/mL (ref <20)

## 2012-06-20 MED ORDER — FERROUS FUM-IRON POLYSACCH-FA 162-115.2-1 MG PO CAPS: 1.0000 | ORAL_CAPSULE | Freq: Every day | ORAL | Status: DC

## 2012-06-20 NOTE — Telephone Encounter (Signed)
Informed pt of the need to try Tandem and begin B12 injections again. Pt stated understanding and will come in am for her 1st injection.

## 2012-06-20 NOTE — Telephone Encounter (Signed)
Message copied by Florene Glen on Mon Jun 20, 2012  4:29 PM ------      Message from: [place], [place] R      Created: Fri Jun 17, 2012  2:27 PM       Needs daily tandem and B12 shots weekly x3.

## 2012-06-21 ENCOUNTER — Ambulatory Visit (INDEPENDENT_AMBULATORY_CARE_PROVIDER_SITE_OTHER): Payer: Medicaid Other | Admitting: Gastroenterology

## 2012-06-21 DIAGNOSIS — E538 Deficiency of other specified B group vitamins: Secondary | ICD-10-CM

## 2012-06-21 LAB — RETICULIN ANTIBODIES, IGA W TITER: Reticulin Ab, IgA: NEGATIVE

## 2012-06-21 MED ORDER — CYANOCOBALAMIN 1000 MCG/ML IJ SOLN
1000.0000 ug | Freq: Once | INTRAMUSCULAR | Status: AC
  Administered 2012-06-21: 1000 ug via INTRAMUSCULAR

## 2012-06-28 ENCOUNTER — Ambulatory Visit (INDEPENDENT_AMBULATORY_CARE_PROVIDER_SITE_OTHER): Payer: Medicaid Other | Admitting: Gastroenterology

## 2012-06-28 DIAGNOSIS — E538 Deficiency of other specified B group vitamins: Secondary | ICD-10-CM

## 2012-06-28 MED ORDER — CYANOCOBALAMIN 1000 MCG/ML IJ SOLN
1000.0000 ug | Freq: Once | INTRAMUSCULAR | Status: AC
  Administered 2012-06-28: 1000 ug via INTRAMUSCULAR

## 2012-07-05 ENCOUNTER — Ambulatory Visit (INDEPENDENT_AMBULATORY_CARE_PROVIDER_SITE_OTHER): Payer: Medicaid Other | Admitting: Gastroenterology

## 2012-07-05 DIAGNOSIS — D519 Vitamin B12 deficiency anemia, unspecified: Secondary | ICD-10-CM

## 2012-07-05 DIAGNOSIS — D518 Other vitamin B12 deficiency anemias: Secondary | ICD-10-CM

## 2012-07-05 MED ORDER — CYANOCOBALAMIN 1000 MCG/ML IJ SOLN
1000.0000 ug | Freq: Once | INTRAMUSCULAR | Status: AC
  Administered 2012-07-05: 1000 ug via INTRAMUSCULAR

## 2012-11-01 ENCOUNTER — Other Ambulatory Visit (HOSPITAL_COMMUNITY): Payer: Self-pay | Admitting: Family Medicine

## 2012-11-01 DIAGNOSIS — Z1231 Encounter for screening mammogram for malignant neoplasm of breast: Secondary | ICD-10-CM

## 2012-11-09 ENCOUNTER — Ambulatory Visit (HOSPITAL_COMMUNITY)
Admission: RE | Admit: 2012-11-09 | Discharge: 2012-11-09 | Disposition: A | Discharge status: Home / Self Care | Payer: Medicaid Other | Source: Ambulatory Visit | Attending: Family Medicine | Admitting: Family Medicine

## 2012-11-09 DIAGNOSIS — Z1231 Encounter for screening mammogram for malignant neoplasm of breast: Secondary | ICD-10-CM

## 2012-11-15 ENCOUNTER — Other Ambulatory Visit: Payer: Self-pay | Admitting: Family Medicine

## 2012-11-15 DIAGNOSIS — R928 Other abnormal and inconclusive findings on diagnostic imaging of breast: Secondary | ICD-10-CM

## 2012-11-24 ENCOUNTER — Ambulatory Visit
Admission: RE | Admit: 2012-11-24 | Discharge: 2012-11-24 | Disposition: A | Discharge status: Home / Self Care | Payer: Medicaid Other | Source: Ambulatory Visit | Attending: Family Medicine | Admitting: Family Medicine

## 2012-11-24 DIAGNOSIS — R928 Other abnormal and inconclusive findings on diagnostic imaging of breast: Secondary | ICD-10-CM

## 2014-01-09 ENCOUNTER — Other Ambulatory Visit (HOSPITAL_COMMUNITY): Payer: Self-pay | Admitting: Family Medicine

## 2014-01-09 DIAGNOSIS — Z1231 Encounter for screening mammogram for malignant neoplasm of breast: Secondary | ICD-10-CM

## 2014-01-17 ENCOUNTER — Ambulatory Visit (HOSPITAL_COMMUNITY)
Admission: RE | Admit: 2014-01-17 | Discharge: 2014-01-17 | Disposition: A | Discharge status: Home / Self Care | Payer: Medicaid Other | Source: Ambulatory Visit | Attending: Family Medicine | Admitting: Family Medicine

## 2014-01-17 DIAGNOSIS — Z1231 Encounter for screening mammogram for malignant neoplasm of breast: Secondary | ICD-10-CM | POA: Insufficient documentation

## 2015-01-10 ENCOUNTER — Other Ambulatory Visit (HOSPITAL_COMMUNITY): Payer: Self-pay | Admitting: Family Medicine

## 2015-01-10 DIAGNOSIS — Z1231 Encounter for screening mammogram for malignant neoplasm of breast: Secondary | ICD-10-CM

## 2015-01-14 ENCOUNTER — Other Ambulatory Visit (HOSPITAL_COMMUNITY): Payer: Self-pay | Admitting: Obstetrics and Gynecology

## 2015-01-25 ENCOUNTER — Ambulatory Visit (HOSPITAL_COMMUNITY)
Admission: RE | Admit: 2015-01-25 | Discharge: 2015-01-25 | Disposition: A | Discharge status: Home / Self Care | Payer: Medicaid Other | Source: Ambulatory Visit | Attending: Family Medicine | Admitting: Family Medicine

## 2015-01-25 ENCOUNTER — Ambulatory Visit (HOSPITAL_COMMUNITY): Payer: Medicaid Other

## 2015-01-25 DIAGNOSIS — Z1231 Encounter for screening mammogram for malignant neoplasm of breast: Secondary | ICD-10-CM | POA: Diagnosis not present

## 2015-03-06 ENCOUNTER — Encounter: Payer: Self-pay | Admitting: Gastroenterology

## 2016-02-04 ENCOUNTER — Other Ambulatory Visit: Payer: Self-pay

## 2016-02-04 DIAGNOSIS — Z1231 Encounter for screening mammogram for malignant neoplasm of breast: Secondary | ICD-10-CM

## 2016-02-17 ENCOUNTER — Other Ambulatory Visit: Payer: Self-pay | Admitting: Family Medicine

## 2016-02-17 ENCOUNTER — Ambulatory Visit
Admission: RE | Admit: 2016-02-17 | Discharge: 2016-02-17 | Disposition: A | Discharge status: Home / Self Care | Payer: Medicaid Other | Source: Ambulatory Visit

## 2016-02-17 DIAGNOSIS — Z1231 Encounter for screening mammogram for malignant neoplasm of breast: Secondary | ICD-10-CM

## 2016-02-19 ENCOUNTER — Other Ambulatory Visit: Payer: Self-pay | Admitting: Family Medicine

## 2016-02-19 DIAGNOSIS — R928 Other abnormal and inconclusive findings on diagnostic imaging of breast: Secondary | ICD-10-CM

## 2016-02-26 ENCOUNTER — Ambulatory Visit
Admission: RE | Admit: 2016-02-26 | Discharge: 2016-02-26 | Disposition: A | Discharge status: Home / Self Care | Payer: Medicaid Other | Source: Ambulatory Visit | Attending: Family Medicine | Admitting: Family Medicine

## 2016-02-26 ENCOUNTER — Other Ambulatory Visit: Payer: Self-pay | Admitting: Family Medicine

## 2016-02-26 DIAGNOSIS — N6320 Unspecified lump in the left breast, unspecified quadrant: Secondary | ICD-10-CM

## 2016-02-26 DIAGNOSIS — R928 Other abnormal and inconclusive findings on diagnostic imaging of breast: Secondary | ICD-10-CM

## 2016-02-26 HISTORY — PX: BREAST EXCISIONAL BIOPSY: SUR124

## 2016-03-07 DIAGNOSIS — N6320 Unspecified lump in the left breast, unspecified quadrant: Secondary | ICD-10-CM

## 2016-03-07 HISTORY — DX: Unspecified lump in the left breast, unspecified quadrant: N63.20

## 2016-03-23 ENCOUNTER — Other Ambulatory Visit: Payer: Self-pay | Admitting: General Surgery

## 2016-03-23 DIAGNOSIS — N6320 Unspecified lump in the left breast, unspecified quadrant: Secondary | ICD-10-CM | POA: Insufficient documentation

## 2016-03-24 ENCOUNTER — Other Ambulatory Visit: Payer: Self-pay | Admitting: General Surgery

## 2016-03-24 DIAGNOSIS — N6320 Unspecified lump in the left breast, unspecified quadrant: Secondary | ICD-10-CM

## 2016-03-31 ENCOUNTER — Encounter (HOSPITAL_BASED_OUTPATIENT_CLINIC_OR_DEPARTMENT_OTHER): Payer: Self-pay | Admitting: *Deleted

## 2016-04-03 ENCOUNTER — Ambulatory Visit
Admission: RE | Admit: 2016-04-03 | Discharge: 2016-04-03 | Disposition: A | Discharge status: Home / Self Care | Payer: Medicaid Other | Source: Ambulatory Visit | Attending: General Surgery | Admitting: General Surgery

## 2016-04-03 DIAGNOSIS — N6320 Unspecified lump in the left breast, unspecified quadrant: Secondary | ICD-10-CM

## 2016-04-03 NOTE — Pre-Procedure Instructions (Signed)
Pt in to pick up Boost Breeze for surgery. Instructions reviewed.

## 2016-04-06 ENCOUNTER — Ambulatory Visit (HOSPITAL_BASED_OUTPATIENT_CLINIC_OR_DEPARTMENT_OTHER): Payer: Medicaid Other | Admitting: Anesthesiology

## 2016-04-06 ENCOUNTER — Ambulatory Visit (HOSPITAL_BASED_OUTPATIENT_CLINIC_OR_DEPARTMENT_OTHER)
Admission: RE | Admit: 2016-04-06 | Discharge: 2016-04-06 | Disposition: A | Discharge status: Home / Self Care | Payer: Medicaid Other | Source: Ambulatory Visit | Attending: General Surgery | Admitting: General Surgery

## 2016-04-06 ENCOUNTER — Ambulatory Visit
Admission: RE | Admit: 2016-04-06 | Discharge: 2016-04-06 | Disposition: A | Discharge status: Home / Self Care | Payer: Medicaid Other | Source: Ambulatory Visit | Attending: General Surgery | Admitting: General Surgery

## 2016-04-06 ENCOUNTER — Encounter (HOSPITAL_BASED_OUTPATIENT_CLINIC_OR_DEPARTMENT_OTHER)
Admission: RE | Disposition: A | Discharge status: Home / Self Care | Payer: Self-pay | Source: Ambulatory Visit | Attending: General Surgery

## 2016-04-06 ENCOUNTER — Encounter (HOSPITAL_BASED_OUTPATIENT_CLINIC_OR_DEPARTMENT_OTHER): Payer: Self-pay | Admitting: *Deleted

## 2016-04-06 DIAGNOSIS — N62 Hypertrophy of breast: Secondary | ICD-10-CM | POA: Insufficient documentation

## 2016-04-06 DIAGNOSIS — N6320 Unspecified lump in the left breast, unspecified quadrant: Secondary | ICD-10-CM

## 2016-04-06 DIAGNOSIS — N63 Unspecified lump in breast: Secondary | ICD-10-CM | POA: Diagnosis present

## 2016-04-06 HISTORY — PX: RADIOACTIVE SEED GUIDED EXCISIONAL BREAST BIOPSY: SHX6490

## 2016-04-06 HISTORY — DX: Ulcerative colitis, unspecified, without complications: K51.90

## 2016-04-06 HISTORY — DX: Headache, unspecified: R51.9

## 2016-04-06 HISTORY — DX: Headache: R51

## 2016-04-06 HISTORY — DX: Unspecified lump in the left breast, unspecified quadrant: N63.20

## 2016-04-06 SURGERY — RADIOACTIVE SEED GUIDED BREAST BIOPSY
Anesthesia: General | Site: Breast | Laterality: Left

## 2016-04-06 MED ORDER — PROMETHAZINE HCL 25 MG/ML IJ SOLN: 6.2500 mg | INTRAMUSCULAR | Status: DC | PRN

## 2016-04-06 MED ORDER — ACETAMINOPHEN 500 MG PO TABS
1000.0000 mg | ORAL_TABLET | ORAL | Status: AC
  Administered 2016-04-06: 1000 mg via ORAL

## 2016-04-06 MED ORDER — CEFAZOLIN SODIUM-DEXTROSE 2-4 GM/100ML-% IV SOLN
2.0000 g | INTRAVENOUS | Status: AC
  Administered 2016-04-06: 2 g via INTRAVENOUS

## 2016-04-06 MED ORDER — CELECOXIB 200 MG PO CAPS
ORAL_CAPSULE | ORAL | Status: AC
  Filled 2016-04-06: qty 2

## 2016-04-06 MED ORDER — ONDANSETRON HCL 4 MG/2ML IJ SOLN
INTRAMUSCULAR | Status: AC
  Filled 2016-04-06: qty 2

## 2016-04-06 MED ORDER — LACTATED RINGERS IV SOLN
INTRAVENOUS | Status: DC
  Administered 2016-04-06: 13:00:00 via INTRAVENOUS

## 2016-04-06 MED ORDER — ACETAMINOPHEN 500 MG PO TABS
ORAL_TABLET | ORAL | Status: AC
  Filled 2016-04-06: qty 2

## 2016-04-06 MED ORDER — ONDANSETRON HCL 4 MG/2ML IJ SOLN
INTRAMUSCULAR | Status: DC | PRN
  Administered 2016-04-06: 4 mg via INTRAVENOUS

## 2016-04-06 MED ORDER — GABAPENTIN 300 MG PO CAPS
ORAL_CAPSULE | ORAL | Status: AC
  Filled 2016-04-06: qty 1

## 2016-04-06 MED ORDER — MIDAZOLAM HCL 2 MG/2ML IJ SOLN
1.0000 mg | INTRAMUSCULAR | Status: DC | PRN
  Administered 2016-04-06: 2 mg via INTRAVENOUS

## 2016-04-06 MED ORDER — BUPIVACAINE HCL (PF) 0.25 % IJ SOLN
INTRAMUSCULAR | Status: DC | PRN
  Administered 2016-04-06: 10 mL

## 2016-04-06 MED ORDER — CELECOXIB 400 MG PO CAPS
400.0000 mg | ORAL_CAPSULE | ORAL | Status: AC
  Administered 2016-04-06: 400 mg via ORAL

## 2016-04-06 MED ORDER — GLYCOPYRROLATE 0.2 MG/ML IJ SOLN: 0.2000 mg | Freq: Once | INTRAMUSCULAR | Status: DC | PRN

## 2016-04-06 MED ORDER — FENTANYL CITRATE (PF) 100 MCG/2ML IJ SOLN
INTRAMUSCULAR | Status: AC
  Filled 2016-04-06: qty 2

## 2016-04-06 MED ORDER — FENTANYL CITRATE (PF) 100 MCG/2ML IJ SOLN
50.0000 ug | INTRAMUSCULAR | Status: DC | PRN
  Administered 2016-04-06: 100 ug via INTRAVENOUS

## 2016-04-06 MED ORDER — LIDOCAINE 2% (20 MG/ML) 5 ML SYRINGE
INTRAMUSCULAR | Status: AC
  Filled 2016-04-06: qty 5

## 2016-04-06 MED ORDER — HYDROCODONE-ACETAMINOPHEN 10-325 MG PO TABS: 1.0000 | ORAL_TABLET | Freq: Four times a day (QID) | ORAL | 0 refills | Status: AC | PRN

## 2016-04-06 MED ORDER — DEXAMETHASONE SODIUM PHOSPHATE 10 MG/ML IJ SOLN
INTRAMUSCULAR | Status: AC
  Filled 2016-04-06: qty 1

## 2016-04-06 MED ORDER — MIDAZOLAM HCL 2 MG/2ML IJ SOLN
INTRAMUSCULAR | Status: AC
  Filled 2016-04-06: qty 2

## 2016-04-06 MED ORDER — PROPOFOL 10 MG/ML IV BOLUS
INTRAVENOUS | Status: DC | PRN
  Administered 2016-04-06: 200 mg via INTRAVENOUS

## 2016-04-06 MED ORDER — CEFAZOLIN SODIUM-DEXTROSE 2-4 GM/100ML-% IV SOLN
INTRAVENOUS | Status: AC
  Filled 2016-04-06: qty 100

## 2016-04-06 MED ORDER — GABAPENTIN 300 MG PO CAPS
300.0000 mg | ORAL_CAPSULE | ORAL | Status: AC
  Administered 2016-04-06: 300 mg via ORAL

## 2016-04-06 MED ORDER — LIDOCAINE 2% (20 MG/ML) 5 ML SYRINGE
INTRAMUSCULAR | Status: DC | PRN
  Administered 2016-04-06: 100 mg via INTRAVENOUS

## 2016-04-06 MED ORDER — DEXAMETHASONE SODIUM PHOSPHATE 4 MG/ML IJ SOLN
INTRAMUSCULAR | Status: DC | PRN
  Administered 2016-04-06: 10 mg via INTRAVENOUS

## 2016-04-06 MED ORDER — SCOPOLAMINE 1 MG/3DAYS TD PT72: 1.0000 | MEDICATED_PATCH | Freq: Once | TRANSDERMAL | Status: DC | PRN

## 2016-04-06 MED ORDER — FENTANYL CITRATE (PF) 100 MCG/2ML IJ SOLN: 25.0000 ug | INTRAMUSCULAR | Status: DC | PRN

## 2016-04-06 SURGICAL SUPPLY — 57 items
BINDER BREAST LRG (GAUZE/BANDAGES/DRESSINGS) ×1 IMPLANT
BINDER BREAST MEDIUM (GAUZE/BANDAGES/DRESSINGS) IMPLANT
BINDER BREAST XLRG (GAUZE/BANDAGES/DRESSINGS) IMPLANT
BINDER BREAST XXLRG (GAUZE/BANDAGES/DRESSINGS) IMPLANT
BLADE SURG 15 STRL LF DISP TIS (BLADE) ×1 IMPLANT
BLADE SURG 15 STRL SS (BLADE) ×2
CANISTER SUC SOCK COL 7IN (MISCELLANEOUS) IMPLANT
CANISTER SUCT 1200ML W/VALVE (MISCELLANEOUS) IMPLANT
CHLORAPREP W/TINT 26ML (MISCELLANEOUS) ×2 IMPLANT
CLIP TI WIDE RED SMALL 6 (CLIP) ×1 IMPLANT
COVER BACK TABLE 60X90IN (DRAPES) ×2 IMPLANT
COVER MAYO STAND STRL (DRAPES) ×2 IMPLANT
COVER PROBE W GEL 5X96 (DRAPES) ×2 IMPLANT
DECANTER SPIKE VIAL GLASS SM (MISCELLANEOUS) IMPLANT
DEVICE DUBIN W/COMP PLATE 8390 (MISCELLANEOUS) ×2 IMPLANT
DRAPE LAPAROSCOPIC ABDOMINAL (DRAPES) ×2 IMPLANT
DRAPE UTILITY XL STRL (DRAPES) ×2 IMPLANT
DRSG TEGADERM 4X4.75 (GAUZE/BANDAGES/DRESSINGS) IMPLANT
ELECT COATED BLADE 2.86 ST (ELECTRODE) ×2 IMPLANT
ELECT REM PT RETURN 9FT ADLT (ELECTROSURGICAL) ×2
ELECTRODE REM PT RTRN 9FT ADLT (ELECTROSURGICAL) ×1 IMPLANT
GLOVE BIO SURGEON STRL SZ7 (GLOVE) ×4 IMPLANT
GLOVE BIOGEL PI IND STRL 7.0 (GLOVE) ×2 IMPLANT
GLOVE BIOGEL PI IND STRL 7.5 (GLOVE) ×1 IMPLANT
GLOVE BIOGEL PI INDICATOR 7.0 (GLOVE) ×2
GLOVE BIOGEL PI INDICATOR 7.5 (GLOVE) ×1
GLOVE ECLIPSE 6.5 STRL STRAW (GLOVE) ×1 IMPLANT
GOWN STRL REUS W/ TWL LRG LVL3 (GOWN DISPOSABLE) ×2 IMPLANT
GOWN STRL REUS W/TWL LRG LVL3 (GOWN DISPOSABLE) ×4
HEMOSTAT ARISTA ABSORB 3G PWDR (MISCELLANEOUS) ×1 IMPLANT
ILLUMINATOR WAVEGUIDE N/F (MISCELLANEOUS) ×1 IMPLANT
KIT MARKER MARGIN INK (KITS) ×2 IMPLANT
LIGHT WAVEGUIDE WIDE FLAT (MISCELLANEOUS) IMPLANT
LIQUID BAND (GAUZE/BANDAGES/DRESSINGS) ×2 IMPLANT
NDL HYPO 25X1 1.5 SAFETY (NEEDLE) ×1 IMPLANT
NEEDLE HYPO 25X1 1.5 SAFETY (NEEDLE) ×2 IMPLANT
NS IRRIG 1000ML POUR BTL (IV SOLUTION) ×1 IMPLANT
PACK BASIN DAY SURGERY FS (CUSTOM PROCEDURE TRAY) ×2 IMPLANT
PENCIL BUTTON HOLSTER BLD 10FT (ELECTRODE) ×2 IMPLANT
SHEET MEDIUM DRAPE 40X70 STRL (DRAPES) IMPLANT
SLEEVE SCD COMPRESS KNEE MED (MISCELLANEOUS) ×2 IMPLANT
SPONGE GAUZE 4X4 12PLY STER LF (GAUZE/BANDAGES/DRESSINGS) IMPLANT
SPONGE LAP 4X18 X RAY DECT (DISPOSABLE) ×2 IMPLANT
STRIP CLOSURE SKIN 1/2X4 (GAUZE/BANDAGES/DRESSINGS) ×2 IMPLANT
SUT MNCRL AB 4-0 PS2 18 (SUTURE) IMPLANT
SUT MON AB 5-0 PS2 18 (SUTURE) IMPLANT
SUT SILK 2 0 SH (SUTURE) IMPLANT
SUT VIC AB 2-0 SH 27 (SUTURE) ×2
SUT VIC AB 2-0 SH 27XBRD (SUTURE) ×1 IMPLANT
SUT VIC AB 3-0 SH 27 (SUTURE) ×2
SUT VIC AB 3-0 SH 27X BRD (SUTURE) ×1 IMPLANT
SUT VIC AB 5-0 PS2 18 (SUTURE) IMPLANT
SYR CONTROL 10ML LL (SYRINGE) ×2 IMPLANT
TOWEL OR 17X24 6PK STRL BLUE (TOWEL DISPOSABLE) ×2 IMPLANT
TOWEL OR NON WOVEN STRL DISP B (DISPOSABLE) ×2 IMPLANT
TUBE CONNECTING 20X1/4 (TUBING) IMPLANT
YANKAUER SUCT BULB TIP NO VENT (SUCTIONS) IMPLANT

## 2016-04-06 NOTE — Discharge Instructions (Signed)
[facility] °Office Phone Number [phone] ° °POST OP INSTRUCTIONS ° °Always review your discharge instruction sheet given to you by the facility where your surgery was performed. ° °IF YOU HAVE DISABILITY OR FAMILY LEAVE FORMS, YOU MUST BRING THEM TO THE OFFICE FOR PROCESSING.  DO NOT GIVE THEM TO YOUR DOCTOR. ° °1. A prescription for pain medication may be given to you upon discharge.  Take your pain medication as prescribed, if needed.  If narcotic pain medicine is not needed, then you may take acetaminophen (Tylenol), naprosyn (Alleve) or ibuprofen (Advil) as needed. °2. Take your usually prescribed medications unless otherwise directed °3. If you need a refill on your pain medication, please contact your pharmacy.  They will contact our office to request authorization.  Prescriptions will not be filled after 5pm or on week-ends. ° ° °Post Anesthesia Home Care Instructions ° °Activity: °Get plenty of rest for the remainder of the day. A responsible adult should stay with you for 24 hours following the procedure.  °For the next 24 hours, DO NOT: °-Drive a car °-Operate machinery °-Drink alcoholic beverages °-Take any medication unless instructed by your physician °-Make any legal decisions or sign important papers. ° °Meals: °Start with liquid foods such as gelatin or soup. Progress to regular foods as tolerated. Avoid greasy, spicy, heavy foods. If nausea and/or vomiting occur, drink only clear liquids until the nausea and/or vomiting subsides. Call your physician if vomiting continues. ° °Special Instructions/Symptoms: °Your throat may feel dry or sore from the anesthesia or the breathing tube placed in your throat during surgery. If this causes discomfort, gargle with warm salt water. The discomfort should disappear within 24 hours. ° °If you had a scopolamine patch placed behind your ear for the management of post- operative nausea and/or vomiting: ° °1. The medication in the patch is  effective for 72 hours, after which it should be removed.  Wrap patch in a tissue and discard in the trash. Wash hands thoroughly with soap and water. °2. You may remove the patch earlier than 72 hours if you experience unpleasant side effects which may include dry mouth, dizziness or visual disturbances. °3. Avoid touching the patch. Wash your hands with soap and water after contact with the patch. °  ° °4. You should eat very light the first 24 hours after surgery, such as soup, crackers, pudding, etc.  Resume your normal diet the day after surgery. °5. Most patients will experience some swelling and bruising in the breast.  Ice packs and a good support bra will help.  Wear the breast binder provided or a sports bra for 72 hours day and night.  After that wear a sports bra during the day until you return to the office. Swelling and bruising can take several days to resolve.  °6. It is common to experience some constipation if taking pain medication after surgery.  Increasing fluid intake and taking a stool softener will usually help or prevent this problem from occurring.  A mild laxative (Milk of Magnesia or Miralax) should be taken according to package directions if there are no bowel movements after 48 hours. °7. Unless discharge instructions indicate otherwise, you may remove your bandages 48 hours after surgery and you may shower at that time.  You may have steri-strips (small skin tapes) in place directly over the incision.  These strips should be left on the skin for 7-10 days and will come off on their own.  If your surgeon used skin glue   on the incision, you may shower in 24 hours.  The glue will flake off over the next 2-3 weeks.  Any sutures or staples will be removed at the office during your follow-up visit. °8. ACTIVITIES:  You may resume regular daily activities (gradually increasing) beginning the next day.  Wearing a good support bra or sports bra minimizes pain and swelling.  You may have sexual  intercourse when it is comfortable. °a. You may drive when you no longer are taking prescription pain medication, you can comfortably wear a seatbelt, and you can safely maneuver your car and apply brakes. °b. RETURN TO WORK:  ______________________________________________________________________________________ °9. You should see your doctor in the office for a follow-up appointment approximately two weeks after your surgery.  Your doctor’s nurse will typically make your follow-up appointment when she calls you with your pathology report.  Expect your pathology report 3-4 business days after your surgery.  You may call to check if you do not hear from us after three days. °10. OTHER INSTRUCTIONS: _______________________________________________________________________________________________ _____________________________________________________________________________________________________________________________________ °_____________________________________________________________________________________________________________________________________ °_____________________________________________________________________________________________________________________________________ ° °WHEN TO CALL DR WAKEFIELD: °1. Fever over 101.0 °2. Nausea and/or vomiting. °3. Extreme swelling or bruising. °4. Continued bleeding from incision. °5. Increased pain, redness, or drainage from the incision. ° °The clinic staff is available to answer your questions during regular business hours.  Please don’t hesitate to call and ask to speak to one of the nurses for clinical concerns.  If you have a medical emergency, go to the nearest emergency room or call 911.  A surgeon from [facility] is always on call at the hospital. ° °For further questions, please visit [email or web address] ° °

## 2016-04-06 NOTE — Anesthesia Preprocedure Evaluation (Signed)
Anesthesia Evaluation  Patient identified by MRN, date of birth, ID band Patient awake    Reviewed: Allergy & Precautions, H&P , NPO status , Patient's Chart, lab work & pertinent test results  History of Anesthesia Complications Negative for: history of anesthetic complications  Airway Mallampati: II  TM Distance: >3 FB Neck ROM: full    Dental no notable dental hx.    Pulmonary neg pulmonary ROS,    Pulmonary exam normal breath sounds clear to auscultation       Cardiovascular negative cardio ROS Normal cardiovascular exam Rhythm:regular Rate:Normal     Neuro/Psych  Headaches,    GI/Hepatic negative GI ROS, Neg liver ROS, PUD,   Endo/Other  negative endocrine ROS  Renal/GU negative Renal ROS     Musculoskeletal   Abdominal   Peds  Hematology negative hematology ROS (+)   Anesthesia Other Findings   Reproductive/Obstetrics negative OB ROS                             Anesthesia Physical Anesthesia Plan  ASA: II  Anesthesia Plan: General   Post-op Pain Management:    Induction: Intravenous  Airway Management Planned: LMA  Additional Equipment:   Intra-op Plan:   Post-operative Plan:   Informed Consent: I have reviewed the patients History and Physical, chart, labs and discussed the procedure including the risks, benefits and alternatives for the proposed anesthesia with the patient or authorized representative who has indicated his/her understanding and acceptance.   Dental Advisory Given  Plan Discussed with: Anesthesiologist, CRNA and Surgeon  Anesthesia Plan Comments:         Anesthesia Quick Evaluation

## 2016-04-06 NOTE — Anesthesia Procedure Notes (Signed)
Procedure Name: LMA Insertion Date/Time: 04/06/2016 2:47 PM Performed by: Gar Gibbon Pre-anesthesia Checklist: Patient identified, Emergency Drugs available, Suction available and Patient being monitored Patient Re-evaluated:Patient Re-evaluated prior to inductionOxygen Delivery Method: Circle system utilized Preoxygenation: Pre-oxygenation with 100% oxygen Intubation Type: IV induction Ventilation: Mask ventilation without difficulty LMA: LMA inserted LMA Size: 4.0 Number of attempts: 1 Airway Equipment and Method: Bite block Placement Confirmation: positive ETCO2 Tube secured with: Tape Dental Injury: Teeth and Oropharynx as per pre-operative assessment

## 2016-04-06 NOTE — Interval H&P Note (Signed)
History and Physical Interval Note:  04/06/2016 2:25 PM  Kim Hurst  has presented today for surgery, with the diagnosis of left breast mass  The various methods of treatment have been discussed with the patient and family. After consideration of risks, benefits and other options for treatment, the patient has consented to  Procedure(s): RADIOACTIVE SEED GUIDED EXCISIONAL BREAST BIOPSY (Left) as a surgical intervention .  The patient's history has been reviewed, patient examined, no change in status, stable for surgery.  I have reviewed the patient's chart and labs.  Questions were answered to the patient's satisfaction.     [name]

## 2016-04-06 NOTE — Op Note (Signed)
Preoperative diagnoses: left breast mammographic abnormality with discordant biopsy Postoperative diagnosis: Same as above Procedure:Left breast seed guided excisional biopsy Surgeon: Dr. Harden Mo Anesthesia: Gen. Estimated blood loss: Minimal Complications: None Drains: None Specimens:left breast tissue marked with paint Sponge and needle count correct at completion Disposition to recovery stable  Indications: This is a 1 yof who has a left sided mammogram that shows an abnormality that underwent biopsy.  This was discordant and she was referred to discuss options.  We elected to proceed with left sided seed guided excisional biopsy.   Procedure:After informed consent was obtained she was then taken to the operating room. She was given cefazolin. Sequential compression devices on her legs. She was placed under general anesthesia without complication. Her left breast was then prepped and draped in the standard sterile surgical fashion. A surgical timeout was then performed.  I located the radioactive seed with the neoprobe.I infiltrated marcaine in the upper breast.  I then used the neoprobe to guide the excision of the seed and surrounding tissue. This was confirmed by the neoprobe. This was painted. This was then taken for mammogram which confirmed removal of the clip and the seed. This was confirmed by radiology. This was then sent to pathology. Hemostasis was observed. I closed the breast tissue with a 2-0 Vicryl. The dermis was closed with 3-0 Vicryl and the skin with 4-0 Monocryl.Dermabond and steristrips were placed on the incision. She was transferred to recovery stable

## 2016-04-06 NOTE — Transfer of Care (Signed)
Immediate Anesthesia Transfer of Care Note  Patient: Kim Hurst  Procedure(s) Performed: Procedure(s): RADIOACTIVE SEED GUIDED EXCISIONAL BREAST BIOPSY (Left)  Patient Location: PACU  Anesthesia Type:General  Level of Consciousness: awake, sedated and patient cooperative  Airway & Oxygen Therapy: Patient Spontanous Breathing and Patient connected to face mask oxygen  Post-op Assessment: Report given to RN and Post -op Vital signs reviewed and stable  Post vital signs: Reviewed and stable  Last Vitals:  Vitals:   04/06/16 1245  BP: (!) 118/58  Pulse: 81  Resp: 16  Temp: 36.8 C    Last Pain:  Vitals:   04/06/16 1245  TempSrc: Oral         Complications: No apparent anesthesia complications

## 2016-04-06 NOTE — Anesthesia Postprocedure Evaluation (Signed)
Anesthesia Post Note  Patient: Kim Hurst  Procedure(s) Performed: Procedure(s) (LRB): RADIOACTIVE SEED GUIDED EXCISIONAL BREAST BIOPSY (Left)  Patient location during evaluation: PACU Anesthesia Type: General Level of consciousness: awake and alert Pain management: pain level controlled Vital Signs Assessment: post-procedure vital signs reviewed and stable Respiratory status: spontaneous breathing, nonlabored ventilation, respiratory function stable and patient connected to nasal cannula oxygen Cardiovascular status: blood pressure returned to baseline and stable Postop Assessment: no signs of nausea or vomiting Anesthetic complications: no    Last Vitals:  Vitals:   04/06/16 1545 04/06/16 1600  BP: 116/68 116/66  Pulse: 65 74  Resp: 12 13  Temp:      Last Pain:  Vitals:   04/06/16 1245  TempSrc: Oral                 Reino Kent

## 2016-04-06 NOTE — H&P (Signed)
45 yof who is healthy presents after undergoing screening mm that shows a left breast mass. she is seen in consultation from Dr Juel Burrow. her density is b. she underwent additional views that show an anterior mass that ends up on [place] being a 6.5 mm simple cyst. the mm also shows a spiculated mass in the central posterior left breast, there is no [place] correlate, [place] left axilla negative. she underwent tomo guided biopsy that shows adenosis, fcc and pash. this is discordant to radiology. she has no issues with either breast, no mass or dc. she has no family or personal history. 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   Other Problems Gilmer Mor, CMA; 03/12/2016 2:48 PM) Ulcerative Colitis  Past Surgical History Gilmer Mor, CMA; 03/12/2016 2:48 PM) Breast Biopsy Left.  Diagnostic Studies History Gilmer Mor, CMA; 03/12/2016 2:48 PM) Colonoscopy 1-5 years ago Mammogram within last year Pap Smear 1-5 years ago  Allergies Gilmer Mor, CMA; 03/12/2016 2:49 PM) No Known Drug Allergies07/02/2016  Medication History Lamar Laundry Bynum, CMA; 03/12/2016 2:49 PM) No Current Medications Medications Reconciled  Social History Gilmer Mor, CMA; 03/12/2016 2:48 PM) Alcohol use Occasional alcohol use. Caffeine use Coffee. No drug use Tobacco use Never smoker.  Family History Gilmer Mor, CMA; 03/12/2016 2:48 PM) Alcohol Abuse Mother. Arthritis Mother.  Pregnancy / Birth History Gilmer Mor, CMA; 03/12/2016 2:48 PM) Age at menarche 12 years. Gravida 4 Length (months) of breastfeeding 12-24 Maternal age 19-25 Para 3 Regular periods  Review of Systems Lamar Laundry Bynum CMA; 03/12/2016 2:48 PM) General Present- Fatigue. Not Present- Appetite Loss, Chills, Fever, Night Sweats, Weight Gain and Weight Loss. Skin Present- Dryness. Not Present- Change in Wart/Mole, Hives, Jaundice, New Lesions, Non-Healing Wounds, Rash and Ulcer. HEENT Present- Wears glasses/contact lenses. Not Present- Earache, Hearing Loss,  Hoarseness, Nose Bleed, Oral Ulcers, Ringing in the Ears, Seasonal Allergies, Sinus Pain, Sore Throat, Visual Disturbances and Yellow Eyes. Cardiovascular Not Present- Chest Pain, Difficulty Breathing Lying Down, Leg Cramps, Palpitations, Rapid Heart Rate, Shortness of Breath and Swelling of Extremities. Gastrointestinal Not Present- Abdominal Pain, Bloating, Bloody Stool, Change in Bowel Habits, Chronic diarrhea, Constipation, Difficulty Swallowing, Excessive gas, Gets full quickly at meals, Hemorrhoids, Indigestion, Nausea, Rectal Pain and Vomiting. Female Genitourinary Present- Frequency. Not Present- Nocturia, Painful Urination, Pelvic Pain and Urgency. Musculoskeletal Not Present- Back Pain, Joint Pain, Joint Stiffness, Muscle Pain, Muscle Weakness and Swelling of Extremities. Neurological Present- Headaches. Not Present- Decreased Memory, Fainting, Numbness, Seizures, Tingling, Tremor, Trouble walking and Weakness. Psychiatric Not Present- Anxiety, Bipolar, Change in Sleep Pattern, Depression, Fearful and Frequent crying. Endocrine Not Present- Cold Intolerance, Excessive Hunger, Hair Changes, Heat Intolerance, Hot flashes and New Diabetes. Hematology Not Present- Blood Thinners, Easy Bruising, Excessive bleeding, Gland problems, HIV and Persistent Infections.  Vitals (Sonya Bynum CMA; 03/12/2016 2:49 PM) 03/12/2016 2:48 PM Weight: 136 lb Height: 63in Body Surface Area: 1.64 m Body Mass Index: 24.09 kg/m  Temp.: 37F(Temporal)  Pulse: 79 (Regular)  BP: 124/80 (Sitting, Left Arm, Standard)   Physical Exam Emelia Loron MD; 03/12/2016 3:51 PM) General Mental Status-Alert. Orientation-Oriented X3.  Eye Sclera/Conjunctiva - Bilateral-No scleral icterus.  Chest and Lung Exam Chest and lung exam reveals -quiet, even and easy respiratory effort with no use of accessory muscles and on auscultation, normal breath sounds, no adventitious sounds and normal vocal  resonance.  Breast Nipples-No Discharge. Breast Lump-No Palpable Breast Mass.  Cardiovascular Cardiovascular examination reveals -normal heart sounds, regular rate and rhythm with no murmurs.  Lymphatic Head & Neck  General Head &  Neck Lymphatics: Bilateral - Description - Normal. Axillary  General Axillary Region: Bilateral - Description - Normal. Note: no [place] adenopathy   Assessment & Plan Emelia Loron MD; 03/12/2016 3:55 PM) LEFT BREAST MASS (N63) Story: we discussed options including close observation vs excision. she understands this is discordant and there is still concern for malignancy. we have elected to proceed with left breast seed guided excisional biopsy to rule out cancer. we discussed procedure, risks and recovery.

## 2016-04-08 ENCOUNTER — Encounter (HOSPITAL_BASED_OUTPATIENT_CLINIC_OR_DEPARTMENT_OTHER): Payer: Self-pay | Admitting: General Surgery

## 2016-11-05 ENCOUNTER — Emergency Department (HOSPITAL_COMMUNITY)
Admission: EM | Admit: 2016-11-05 | Discharge: 2016-11-05 | Disposition: A | Discharge status: Home / Self Care | Payer: Medicaid Other | Attending: Emergency Medicine | Admitting: Emergency Medicine

## 2016-11-05 ENCOUNTER — Emergency Department (HOSPITAL_COMMUNITY): Payer: Medicaid Other

## 2016-11-05 ENCOUNTER — Encounter (HOSPITAL_COMMUNITY): Payer: Self-pay

## 2016-11-05 DIAGNOSIS — M25511 Pain in right shoulder: Secondary | ICD-10-CM | POA: Insufficient documentation

## 2016-11-05 DIAGNOSIS — Z79899 Other long term (current) drug therapy: Secondary | ICD-10-CM | POA: Insufficient documentation

## 2016-11-05 LAB — CBC
HCT: 29.4 % — ABNORMAL LOW (ref 36.0–46.0)
HEMOGLOBIN: 8.9 g/dL — AB (ref 12.0–15.0)
MCH: 21.5 pg — ABNORMAL LOW (ref 26.0–34.0)
MCHC: 30.3 g/dL (ref 30.0–36.0)
MCV: 71 fL — ABNORMAL LOW (ref 78.0–100.0)
PLATELETS: 424 10*3/uL — AB (ref 150–400)
RBC: 4.14 MIL/uL (ref 3.87–5.11)
RDW: 18.3 % — ABNORMAL HIGH (ref 11.5–15.5)
WBC: 5.2 10*3/uL (ref 4.0–10.5)

## 2016-11-05 LAB — BASIC METABOLIC PANEL
Anion gap: 6 (ref 5–15)
BUN: 8 mg/dL (ref 6–20)
CHLORIDE: 107 mmol/L (ref 101–111)
CO2: 25 mmol/L (ref 22–32)
Calcium: 9.5 mg/dL (ref 8.9–10.3)
Creatinine, Ser: 0.83 mg/dL (ref 0.44–1.00)
GFR calc Af Amer: >60 mL/min (ref >60)
GFR calc non Af Amer: >60 mL/min (ref >60)
GLUCOSE: 91 mg/dL (ref 65–99)
POTASSIUM: 4.2 mmol/L (ref 3.5–5.1)
Sodium: 138 mmol/L (ref 135–145)

## 2016-11-05 LAB — I-STAT TROPONIN, ED: TROPONIN I, POC: 0 ng/mL (ref 0.00–0.08)

## 2016-11-05 NOTE — ED Triage Notes (Signed)
Pt states she was sitting at work when had sudden onset left shoulder pain radiating to chest.  No shortness of breath.  Denies physical injury.  No cough/congestion.

## 2016-11-05 NOTE — ED Provider Notes (Signed)
[facility]-EMERGENCY DEPT Provider Note   CSN: [id number] Arrival date & time: 11/05/16  1335     History   Chief Complaint Chief Complaint  Patient presents with  . Shoulder Pain  . Chest Pain    HPI Kim Hurst is a 46 y.o. female.  HPI   46 year old female presents today with complaints of right shoulder pain.  Patient notes symptoms started at 11 AM is a sharp wheezing sensation in her right lateral neck and anterior shoulder.  She notes a brief radiation of pain across the chest wall to the left shoulder.  She notes this lasted approximately an hour and then resolved without lingering symptoms.  She notes symptoms were made worse by movement of the head and neck, denied any neurological deficits.  She denied any associated shortness of breath.  No recent infectious etiology, no history DVT or PE.  Patient has no significant past cardiac history or significant risk factors.  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    Past Medical History:  Diagnosis Date  . Anemia    no current med.  . B12 deficiency   . Breast mass, left 03/2016  . Headache    2 x/week  . Ulcerative colitis (HCC)    no current problems    Patient Active Problem List   Diagnosis Date Noted  . Left breast mass 03/23/2016  . THYROID STIMULATING HORMONE, ABNORMAL 10/31/2010  . VITAMIN B12 DEFICIENCY 10/31/2010  . ULCERATIVE COLITIS, LEFT SIDED 10/30/2010  . RECTAL BLEEDING 10/30/2010    Past Surgical History:  Procedure Laterality Date  . CESAREAN SECTION     X 3  . LYSIS OF ADHESION  11/01/2009  . RADIOACTIVE SEED GUIDED EXCISIONAL BREAST BIOPSY Left 04/06/2016   Procedure: RADIOACTIVE SEED GUIDED EXCISIONAL BREAST BIOPSY;  Surgeon: Emelia LoronMatthew Wakefield, MD;  Location: [facility];  Service: General;  Laterality: Left;  . TUBAL LIGATION  11/01/2009    OB History    No data available       Home Medications    Prior to Admission medications     Medication Sig Start Date End Date Taking? Authorizing Provider  HYDROcodone-acetaminophen (NORCO) 10-325 MG tablet Take 1 tablet by mouth every 6 (six) hours as needed. 04/06/16   Emelia LoronMatthew Wakefield, MD    Family History History reviewed. No pertinent family history.  Social History Social History  Substance Use Topics  . Smoking status: Never Smoker  . Smokeless tobacco: Never Used  . Alcohol use Yes     Comment: occasionally     Allergies   Patient has no known allergies.   Review of Systems Review of Systems  All other systems reviewed and are negative.    Physical Exam Updated Vital Signs BP 125/78 (BP Location: Right Arm)   Pulse 73   Temp 98.2 F (36.8 C) (Oral)   Resp 14   LMP 11/05/2016   SpO2 100%   Physical Exam  Constitutional: She is oriented to person, place, and time. She appears well-developed and well-nourished.  HENT:  Head: Normocephalic and atraumatic.  Eyes: Conjunctivae are normal. Pupils are equal, round, and reactive to light. Right eye exhibits no discharge. Left eye exhibits no discharge. No scleral icterus.  Neck: Normal range of motion. No JVD present. No tracheal deviation present.  Cardiovascular: Normal rate, regular rhythm, normal heart sounds and intact distal pulses.  Exam reveals no gallop and no friction rub.   No  murmur heard. Pulmonary/Chest: Effort normal. No stridor. No respiratory distress. She has no wheezes. She has no rales. She exhibits no tenderness.  Chest wall nontender to palpation no signs of trauma or infection  Musculoskeletal: She exhibits no edema.  Minor tenderness to palpation of the right lateral trapezius and neck.  Tenderness to palpation right anterior shoulder, full active range of motion, no swelling or edema.  Grip strength 5 out of 5 distal sensation intact, radial pulse 2+.  Neurological: She is alert and oriented to person, place, and time. Coordination normal.  Psychiatric: She has a normal mood and  affect. Her behavior is normal. Judgment and thought content normal.  Nursing note and vitals reviewed.    ED Treatments / Results  Labs (all labs ordered are listed, but only abnormal results are displayed) Labs Reviewed  CBC - Abnormal; Notable for the following:       Result Value   Hemoglobin 8.9 (*)    HCT 29.4 (*)    MCV 71.0 (*)    MCH 21.5 (*)    RDW 18.3 (*)    Platelets 424 (*)    All other components within normal limits  BASIC METABOLIC PANEL  I-STAT TROPOININ, ED    EKG  EKG Interpretation None       Radiology Dg Chest 2 View  Result Date: 11/05/2016 CLINICAL DATA:  Chest pain EXAM: CHEST  2 VIEW COMPARISON:  None. FINDINGS: Heart and mediastinal contours are within normal limits. No focal opacities or effusions. No acute bony abnormality. IMPRESSION: No active cardiopulmonary disease. Electronically Signed   By: Charlett Nose M.D.   On: 11/05/2016 14:23    Procedures Procedures (including critical care time)  Medications Ordered in ED Medications - No data to display   Initial Impression / Assessment and Plan / ED Course  I have reviewed the triage vital signs and the nursing notes.  Pertinent labs & imaging results that were available during my care of the patient were reviewed by me and considered in my medical decision making (see chart for details).      Final Clinical Impressions(s) / ED Diagnoses   Final diagnoses:  Acute pain of right shoulder    Labs: I-STAT troponin, BMP CBC  Imaging: DG chest 2 view  Consults:  Therapeutics:  Discharge Meds:   Assessment/Plan: 46 year old female presents today with right shoulder neck pain.  She has no complaints at the time of evaluation, symptoms have completely resolved.  Patient's presentation is not consistent with ACS, PE, or any significant intrathoracic abnormality.  She has a very low heart score with reassuring workup here, no significant risk factors for PE.  Patient instructed to use  Tylenol, ibuprofen, ice, rest, follow-up with primary care symptoms persist, return to the emergency room if they worsen.  She verbalized understanding and agreement to this plan and had no further questions or concerns at time of discharge.   New Prescriptions Discharge Medication List as of 11/05/2016  4:44 PM       Eyvonne Mechanic, PA-C 11/05/16 2252    Mancel Bale, MD 11/06/16 [phone]

## 2016-11-05 NOTE — ED Notes (Signed)
Pt left without discharge paperwork. Unable to obtain discharge signature. Pt AOx4, and ambulatory at discharge.

## 2016-11-05 NOTE — Discharge Instructions (Signed)
Please read attached information. If you experience any new or worsening signs or symptoms please return to the emergency room for evaluation. Please follow-up with your primary care provider or specialist as discussed. Please use.

## 2017-07-12 ENCOUNTER — Other Ambulatory Visit: Payer: Self-pay | Admitting: Family Medicine

## 2017-07-12 DIAGNOSIS — Z1231 Encounter for screening mammogram for malignant neoplasm of breast: Secondary | ICD-10-CM

## 2017-08-04 ENCOUNTER — Ambulatory Visit
Admission: RE | Admit: 2017-08-04 | Discharge: 2017-08-04 | Disposition: A | Discharge status: Home / Self Care | Payer: No Typology Code available for payment source | Source: Ambulatory Visit | Attending: Family Medicine | Admitting: Family Medicine

## 2017-08-04 DIAGNOSIS — Z1231 Encounter for screening mammogram for malignant neoplasm of breast: Secondary | ICD-10-CM

## 2018-06-13 ENCOUNTER — Other Ambulatory Visit (HOSPITAL_COMMUNITY): Payer: Self-pay | Admitting: *Deleted

## 2018-06-13 DIAGNOSIS — Z1231 Encounter for screening mammogram for malignant neoplasm of breast: Secondary | ICD-10-CM

## 2018-08-25 ENCOUNTER — Ambulatory Visit (HOSPITAL_COMMUNITY)
Admission: RE | Admit: 2018-08-25 | Discharge: 2018-08-25 | Disposition: A | Discharge status: Home / Self Care | Payer: Medicaid Other | Source: Ambulatory Visit | Attending: Obstetrics and Gynecology | Admitting: Obstetrics and Gynecology

## 2018-08-25 ENCOUNTER — Ambulatory Visit
Admission: RE | Admit: 2018-08-25 | Discharge: 2018-08-25 | Disposition: A | Discharge status: Home / Self Care | Payer: No Typology Code available for payment source | Source: Ambulatory Visit | Attending: Obstetrics and Gynecology | Admitting: Obstetrics and Gynecology

## 2018-08-25 ENCOUNTER — Encounter (HOSPITAL_COMMUNITY): Payer: Self-pay

## 2018-08-25 VITALS — BP 118/72 | Wt 143.0 lb

## 2018-08-25 DIAGNOSIS — Z1239 Encounter for other screening for malignant neoplasm of breast: Secondary | ICD-10-CM

## 2018-08-25 DIAGNOSIS — Z1231 Encounter for screening mammogram for malignant neoplasm of breast: Secondary | ICD-10-CM

## 2018-08-25 NOTE — Progress Notes (Signed)
No complaints today.   Pap Smear: Pap smear not completed today. Last [facility] smear was in June 2017 at [facility] and normal per patient. Per patient has no history of an abnormal Pap smear. No Pap smear results are in Epic.  Physical exam: Breasts Breasts symmetrical. No skin abnormalities bilateral breasts. No nipple retraction bilateral breasts. No nipple discharge bilateral breasts. No lymphadenopathy. No lumps palpated bilateral breasts. No complaints of pain or tenderness on exam. [facility] for a screening mamm[facility]. Appointment scheduled for Thursday, August 25, 2018 at 1550.        Pelvic/Bimanual No Pap smear completed today since last Pap smear was in June 2017 per patient. Pap smear not indicated per BCCCP guidelines.   Smoking History: Patient has never smoked.  Patient Navigation: Patient education provided. Access to services provided for patient through BCCCP program.   Breast and Cervical Cancer Risk Assessment: Patient has no family history of breast cancer, known genetic mutations, or radiation treatment to the chest before age 49. Patient has no history of cervical dysplasia, immunocompromised, or DES exposure in-utero.  Risk Assessment    Risk Scores      08/25/2018   Last edited by: Lynnell DikeHolland, Sabrina H, LPN   5-year risk: 0.9 %   Lifetime risk: 6.8 %

## 2018-08-25 NOTE — Patient Instructions (Addendum)
Explained breast self awareness with Kim Hurst. Patient did not need a Pap smear today due to last Pap smear was in June 2017 per patient. Let her know BCCCP will cover Pap smears every 3 years unless has a history of abnormal Pap smears. Scheduled patient appointment at the free cervical cancer screening at the [facility] on Monday, October 31, 2018 at 1805. Referred patient to the [facility] for a screening mammogram. Appointment scheduled for Thursday, August 25, 2018 at 1550. Patient aware of appointments and will be there. Let patient know the Breast Center will follow-up with her within the next couple of weeks with results of mammogram by letter or phone. Kim Hurst verbalized understanding.  [name], Kathaleen Maserhristine Poll, RN 3:02 PM

## 2018-08-25 NOTE — Addendum Note (Signed)
Encounter addended by: Priscille HeidelbergBrannock, Orlean Holtrop P, RN on: 08/25/2018 3:34 PM  Actions taken: Clinical Note Signed

## 2018-09-06 ENCOUNTER — Encounter (HOSPITAL_COMMUNITY): Payer: Self-pay | Admitting: *Deleted

## 2018-10-31 ENCOUNTER — Other Ambulatory Visit: Payer: Self-pay | Admitting: *Deleted

## 2018-10-31 DIAGNOSIS — Z124 Encounter for screening for malignant neoplasm of cervix: Secondary | ICD-10-CM

## 2018-11-01 NOTE — Progress Notes (Signed)
Patient: Kim Hurst           Date of Birth: April 26, 1971           MRN: [id number] Visit Date: 10/31/2018 PCP: Leilani Able, MD     Cervical Exam Exam not completed.  Patient's History Patient Active Problem List   Diagnosis Date Noted  . Left breast mass 03/23/2016  . THYROID STIMULATING HORMONE, ABNORMAL 10/31/2010  . VITAMIN B12 DEFICIENCY 10/31/2010  . ULCERATIVE COLITIS, LEFT SIDED 10/30/2010  . RECTAL BLEEDING 10/30/2010   Past Medical History:  Diagnosis Date  . Anemia    no current med.  . B12 deficiency   . Breast mass, left 03/2016  . Headache    2 x/week  . Ulcerative colitis (HCC)    no current problems    Family History  Problem Relation Age of Onset  . Cancer Mother        lung    Social History   Occupational History  . Occupation: cna  Tobacco Use  . Smoking status: Never Smoker  . Smokeless tobacco: Never Used  Substance and Sexual Activity  . Alcohol use: Yes    Comment: occasionally  . Drug use: No  . Sexual activity: Not Currently    Birth control/protection: Surgical   Patient examined by Terie Purser, PA-C. Screening notes scanned into Epic.

## 2018-11-09 LAB — CYTOLOGY - PAP
Adequacy: ABSENT
Diagnosis: NEGATIVE

## 2018-12-12 ENCOUNTER — Telehealth (HOSPITAL_COMMUNITY): Payer: Self-pay | Admitting: *Deleted

## 2018-12-12 NOTE — Telephone Encounter (Signed)
Telephoned patient at home number and left message to return call to [facility] 

## 2019-01-31 ENCOUNTER — Encounter (HOSPITAL_COMMUNITY): Payer: Self-pay | Admitting: *Deleted

## 2019-11-04 ENCOUNTER — Ambulatory Visit: Payer: Medicaid Other | Attending: Internal Medicine

## 2019-11-04 DIAGNOSIS — Z23 Encounter for immunization: Secondary | ICD-10-CM

## 2019-11-04 NOTE — Progress Notes (Signed)
   Covid-19 Vaccination Clinic  Name:  Kim Hurst    MRN: [id number] DOB: 18-May-1971  11/04/2019  Kim Hurst was observed post Covid-19 immunization for 15 minutes without incidence. She was provided with Vaccine Information Sheet and instruction to access the V-Safe system.   Kim Hurst was instructed to call 911 with any severe reactions post vaccine: Marland Kitchen Difficulty breathing  . Swelling of your face and throat  . A fast heartbeat  . A bad rash all over your body  . Dizziness and weakness    Immunizations Administered    Name Date Dose VIS Date Route   Pfizer COVID-19 Vaccine 11/04/2019 10:23 AM 0.3 mL 08/18/2019 Intramuscular   Manufacturer: [employer], [employer]   Lot: [id number]   NDC: [address]-2

## 2019-11-25 ENCOUNTER — Ambulatory Visit: Payer: Medicaid Other | Attending: Internal Medicine

## 2019-11-25 DIAGNOSIS — Z23 Encounter for immunization: Secondary | ICD-10-CM

## 2019-11-25 NOTE — Progress Notes (Signed)
   Covid-19 Vaccination Clinic  Name:  Kim Hurst    MRN: [id number] DOB: 1970-09-22  11/25/2019  [name] was observed post Covid-19 immunization for 15 minutes without incident. She was provided with Vaccine Information Sheet and instruction to access the V-Safe system.   [name] was instructed to call 911 with any severe reactions post vaccine: Marland Kitchen Difficulty breathing  . Swelling of face and throat  . A fast heartbeat  . A bad rash all over body  . Dizziness and weakness   Immunizations Administered    Name Date Dose VIS Date Route   Pfizer COVID-19 Vaccine 11/25/2019  2:46 PM 0.3 mL 08/18/2019 Intramuscular   Manufacturer: [employer], [employer]   Lot: [id number]   NDC: [address]-2

## 2019-11-29 ENCOUNTER — Ambulatory Visit: Payer: No Typology Code available for payment source

## 2019-12-18 ENCOUNTER — Other Ambulatory Visit: Payer: Self-pay

## 2019-12-18 DIAGNOSIS — Z1231 Encounter for screening mammogram for malignant neoplasm of breast: Secondary | ICD-10-CM

## 2020-01-18 ENCOUNTER — Ambulatory Visit: Payer: Self-pay | Admitting: *Deleted

## 2020-01-18 ENCOUNTER — Other Ambulatory Visit: Payer: Self-pay

## 2020-01-18 ENCOUNTER — Ambulatory Visit
Admission: RE | Admit: 2020-01-18 | Discharge: 2020-01-18 | Disposition: A | Discharge status: Home / Self Care | Payer: No Typology Code available for payment source | Source: Ambulatory Visit | Attending: Obstetrics and Gynecology | Admitting: Obstetrics and Gynecology

## 2020-01-18 VITALS — BP 115/72 | Temp 97.3°F | Wt 144.7 lb

## 2020-01-18 DIAGNOSIS — Z1231 Encounter for screening mammogram for malignant neoplasm of breast: Secondary | ICD-10-CM

## 2020-01-18 DIAGNOSIS — Z1239 Encounter for other screening for malignant neoplasm of breast: Secondary | ICD-10-CM

## 2020-01-18 NOTE — Patient Instructions (Addendum)
Explained breast self awareness with Theodoro Kalata. Patient did not need a Pap smear today due to last Pap smear was 10/31/2018. Let her know BCCCP will cover Pap smears every 3 years unless has a history of abnormal Pap smears. Referred patient to the [facility] Center of [facility] for a screening mammogram. Appointment scheduled Thursday, Jan 18, 2020 at 1140. Patient aware of appointment and will be there. Let patient know the Breast Center will follow up with her within the next couple weeks with results of mammogram by letter or phone. Theodoro Kalata verbalized understanding.  Eshika Reckart, Kathaleen Maser, RN 10:39 AM

## 2020-01-18 NOTE — Progress Notes (Signed)
[name] is a 49 y.o. female who presents to [facility] clinic today with no complaints.   Pap Smear: Pap not smear completed today. Last Pap smear was 10/31/2018 at the free cervical cancer screening clinic sponsored by [facility] and was normal. Per patient has no history of an abnormal Pap smear. Last Pap smear result is in Epic.   Physical exam: Breasts Breasts symmetrical. No skin abnormalities bilateral breasts. No nipple retraction bilateral breasts. No nipple discharge bilateral breasts. No lymphadenopathy. No lumps palpated bilateral breasts. No complaints of pain or tenderness on exam.       Pelvic/Bimanual Pap is not indicated today per BCCCP guidelines.   Smoking History: Patient has never smoked.   Patient Navigation: Patient education provided. Access to services provided for patient through [facility] program.   Colorectal Cancer Screening: Per patient had a colonoscopy completed in 2013. No complaints today.    Breast and Cervical Cancer Risk Assessment: Patient has no family history of breast cancer, known genetic mutations, or radiation treatment to the chest before age 78. Patient has no history of cervical dysplasia, immunocompromised, or DES exposure in-utero.  Risk Assessment    No risk assessment data for the current encounter   Risk Scores      08/25/2018   Last edited by: Lynnell Dike, LPN   5-year risk: 0.9 %   Lifetime risk: 6.8 %          A: BCCCP exam without pap smear   P: Referred patient to the [facility] Center of [facility] for a screening mammogram. Appointment scheduled Thursday, Jan 18, 2020 at 1140.  Priscille Heidelberg, RN 01/18/2020 10:39 AM

## 2021-03-04 ENCOUNTER — Other Ambulatory Visit: Payer: Self-pay | Admitting: Obstetrics and Gynecology

## 2021-03-04 DIAGNOSIS — Z1231 Encounter for screening mammogram for malignant neoplasm of breast: Secondary | ICD-10-CM

## 2021-04-08 ENCOUNTER — Ambulatory Visit: Payer: Self-pay

## 2021-05-08 ENCOUNTER — Other Ambulatory Visit: Payer: Self-pay | Admitting: Obstetrics and Gynecology

## 2021-05-08 DIAGNOSIS — Z1231 Encounter for screening mammogram for malignant neoplasm of breast: Secondary | ICD-10-CM

## 2021-05-27 ENCOUNTER — Other Ambulatory Visit: Payer: Self-pay

## 2021-05-27 ENCOUNTER — Ambulatory Visit: Payer: Self-pay | Admitting: *Deleted

## 2021-05-27 ENCOUNTER — Ambulatory Visit
Admission: RE | Admit: 2021-05-27 | Discharge: 2021-05-27 | Disposition: A | Discharge status: Home / Self Care | Payer: No Typology Code available for payment source | Source: Ambulatory Visit | Attending: Obstetrics and Gynecology | Admitting: Obstetrics and Gynecology

## 2021-05-27 VITALS — BP 128/80 | Wt 140.4 lb

## 2021-05-27 DIAGNOSIS — Z1211 Encounter for screening for malignant neoplasm of colon: Secondary | ICD-10-CM

## 2021-05-27 DIAGNOSIS — Z1239 Encounter for other screening for malignant neoplasm of breast: Secondary | ICD-10-CM

## 2021-05-27 DIAGNOSIS — Z1231 Encounter for screening mammogram for malignant neoplasm of breast: Secondary | ICD-10-CM

## 2021-05-27 NOTE — Patient Instructions (Signed)
Explained breast self awareness Memory Dance. Patient did not need a Pap smear today due to last Pap smear was 10/31/2018. Let her know BCCCP will cover Pap smears every 3 years unless has a history of abnormal Pap smears. Referred patient to the Breast Center of [facility] for a screening mammogram on the mobile unit. Appointment scheduled Tuesday, May 27, 2021 at 1000. Patient escorted to the mobile unit following BCCCP appointment for her screening mammogram. Let patient know the Breast Center will follow up with her within the next couple weeks with results of her mammogram by letter or phone. Memory Dance verbalized understanding.  Kiyona Mcnall, Kathaleen Maser, RN 9:30 AM

## 2021-05-27 NOTE — Progress Notes (Signed)
[name] is a 50 y.o. female who presents to [facility] clinic today with no complaints.    Pap Smear: Pap smear not completed today. Last Pap smear was 10/31/2018 at the free cervical cancer screening clinic and was normal. Per patient has no history of an abnormal Pap smear. Last Pap smear result is available in Epic.   Physical exam: Breasts Breasts symmetrical. No skin abnormalities bilateral breasts. No nipple retraction bilateral breasts. No nipple discharge bilateral breasts. No lymphadenopathy. No lumps palpated bilateral breasts. No complaints of pain or tenderness on exam.      MS DIGITAL SCREENING BILATERAL  Result Date: 08/04/2017 CLINICAL DATA:  Screening. EXAM: DIGITAL SCREENING BILATERAL MAMMOGRAM WITH CAD COMPARISON:  Previous exam(s). ACR Breast Density Category b: There are scattered areas of fibroglandular density. FINDINGS: There are no findings suspicious for malignancy. Images were processed with CAD. IMPRESSION: No mammographic evidence of malignancy. A result letter of this screening mammogram will be mailed directly to the patient. RECOMMENDATION: Screening mammogram in one year. (Code:SM-B-01Y) BI-RADS CATEGORY  1: Negative. Electronically Signed   By: Sherian Rein M.D.   On: 08/04/2017 12:04   MS DIGITAL SCREENING TOMO BILATERAL  Result Date: 01/18/2020 CLINICAL DATA:  Screening. EXAM: DIGITAL SCREENING BILATERAL MAMMOGRAM WITH TOMO AND CAD COMPARISON:  Previous exam(s). ACR Breast Density Category b: There are scattered areas of fibroglandular density. FINDINGS: There are no findings suspicious for malignancy. Images were processed with CAD. IMPRESSION: No mammographic evidence of malignancy. A result letter of this screening mammogram will be mailed directly to the patient. RECOMMENDATION: Screening mammogram in one year. (Code:SM-B-01Y) BI-RADS CATEGORY  1: Negative. Electronically Signed   By: Amie Portland M.D.   On: 01/18/2020 15:51   MS DIGITAL SCREENING  TOMO BILATERAL  Result Date: 08/26/2018 CLINICAL DATA:  Screening. EXAM: DIGITAL SCREENING BILATERAL MAMMOGRAM WITH TOMO AND CAD COMPARISON:  Previous exam(s). ACR Breast Density Category b: There are scattered areas of fibroglandular density. FINDINGS: There are no findings suspicious for malignancy. Images were processed with CAD. IMPRESSION: No mammographic evidence of malignancy. A result letter of this screening mammogram will be mailed directly to the patient. RECOMMENDATION: Screening mammogram in one year. (Code:SM-B-01Y) BI-RADS CATEGORY  1: Negative. Electronically Signed   By: Amie Portland M.D.   On: 08/26/2018 12:39    Pelvic/Bimanual Pap is not indicated today per BCCCP guidelines.   Smoking History: Patient has never smoked.   Patient Navigation: Patient education provided. Access to services provided for patient through [facility] program.   Colorectal Cancer Screening: Per patient had a colonoscopy completed in 2013. FIT test given to patient to complete. No complaints today.    Breast and Cervical Cancer Risk Assessment: Patient does not have family history of breast cancer, known genetic mutations, or radiation treatment to the chest before age 33. Patient does not have history of cervical dysplasia, immunocompromised, or DES exposure in-utero.  Risk Assessment     Risk Scores       05/27/2021 08/25/2018   Last edited by: Narda Rutherford, LPN Stoney Bang H, LPN   5-year risk: 0.9 % 0.9 %   Lifetime risk: 6.4 % 6.8 %            A: BCCCP exam without pap smear No complaints.  P: Referred patient to the [facility] of [facility] for a screening mammogram on the mobile unit. Appointment scheduled Tuesday, May 27, 2021 at 1000.  Priscille Heidelberg, RN 05/27/2021 9:29 AM

## 2021-06-09 LAB — FECAL OCCULT BLOOD, IMMUNOCHEMICAL: Fecal Occult Bld: POSITIVE — AB

## 2021-06-11 ENCOUNTER — Telehealth: Payer: Self-pay

## 2021-06-11 NOTE — Telephone Encounter (Signed)
Patient informed positive FIT Test results, needs to contact previous GI to follow-up, given her history of ulcerative colitis. Patient verbalized understanding, stated she will complete application for [facility] card, and contact [facility].

## 2021-06-18 ENCOUNTER — Other Ambulatory Visit: Payer: Self-pay | Admitting: Obstetrics and Gynecology

## 2021-06-18 DIAGNOSIS — R928 Other abnormal and inconclusive findings on diagnostic imaging of breast: Secondary | ICD-10-CM

## 2021-06-24 ENCOUNTER — Other Ambulatory Visit: Payer: Self-pay

## 2021-06-24 ENCOUNTER — Ambulatory Visit
Admission: RE | Admit: 2021-06-24 | Discharge: 2021-06-24 | Disposition: A | Discharge status: Home / Self Care | Payer: Self-pay | Source: Ambulatory Visit | Attending: Obstetrics and Gynecology | Admitting: Obstetrics and Gynecology

## 2021-06-24 ENCOUNTER — Ambulatory Visit
Admission: RE | Admit: 2021-06-24 | Discharge: 2021-06-24 | Disposition: A | Discharge status: Home / Self Care | Payer: No Typology Code available for payment source | Source: Ambulatory Visit | Attending: Obstetrics and Gynecology | Admitting: Obstetrics and Gynecology

## 2021-06-24 DIAGNOSIS — R928 Other abnormal and inconclusive findings on diagnostic imaging of breast: Secondary | ICD-10-CM

## 2021-11-05 HISTORY — PX: REDUCTION MAMMAPLASTY: SUR839

## 2021-11-25 ENCOUNTER — Other Ambulatory Visit: Payer: Self-pay | Admitting: Plastic Surgery

## 2022-09-15 ENCOUNTER — Other Ambulatory Visit: Payer: Self-pay | Admitting: Family Medicine

## 2022-09-15 DIAGNOSIS — Z1231 Encounter for screening mammogram for malignant neoplasm of breast: Secondary | ICD-10-CM

## 2022-09-17 IMAGING — US US BREAST*R* LIMITED INC AXILLA
1 series · 13 of 22 positions shown · non-contrast
Comparison: Previous exam(s).

CLINICAL DATA: Patient recalled from screening for right breast
masses.

EXAM:
DIGITAL DIAGNOSTIC UNILATERAL RIGHT MAMMOGRAM WITH TOMOSYNTHESIS AND
CAD; ULTRASOUND RIGHT BREAST LIMITED
TECHNIQUE: Right digital diagnostic mammography and breast tomosynthesis was
performed. The images were evaluated with computer-aided detection.;
Targeted ultrasound examination of the right breast was performed

[Series 1: us breast*right* limited inc axilla · 0.07mm/px · 13 of 22 slices shown]
[im 1/22]
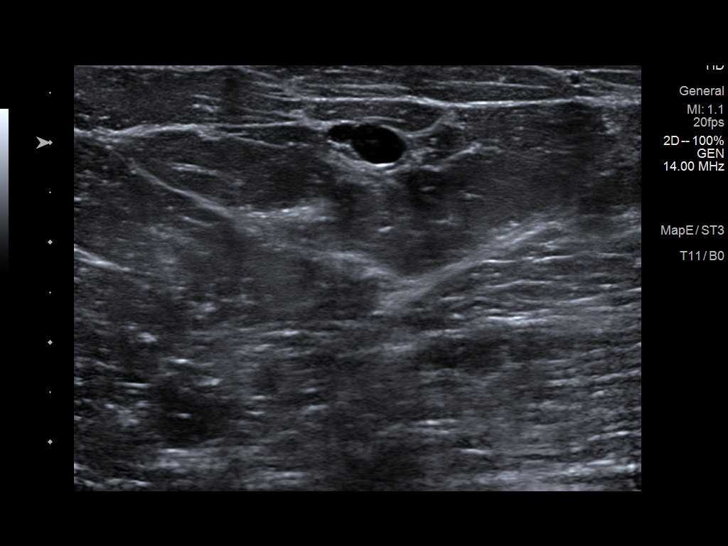
[im 3/22]
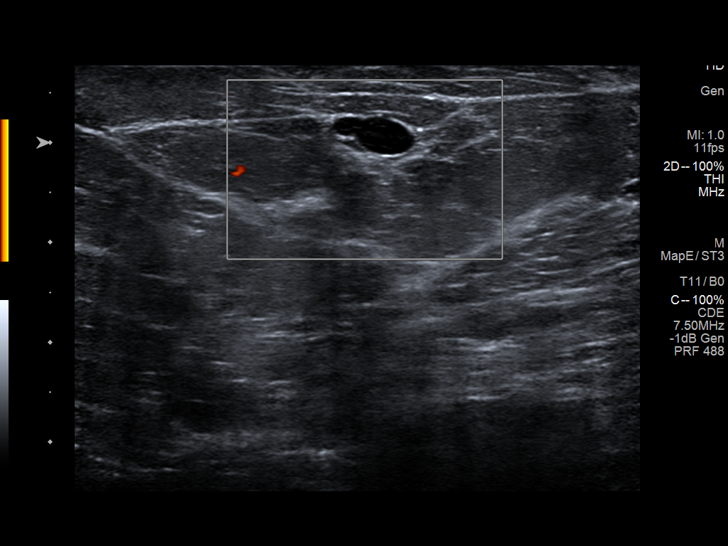
[im 5/22]
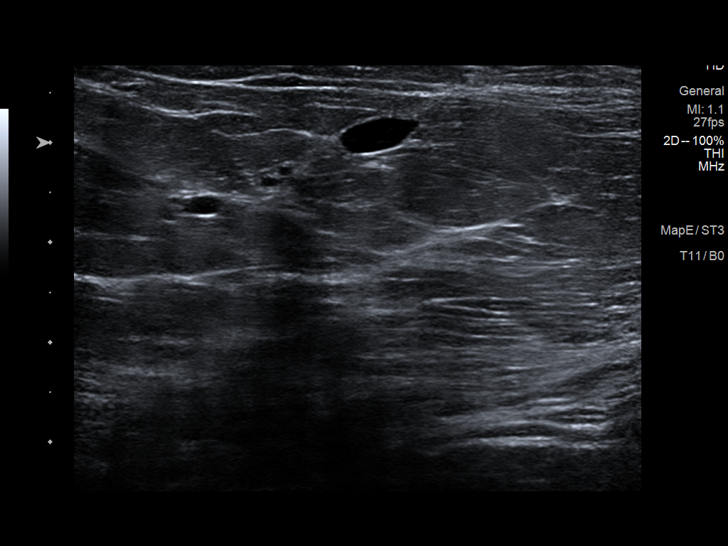
[im 6/22]
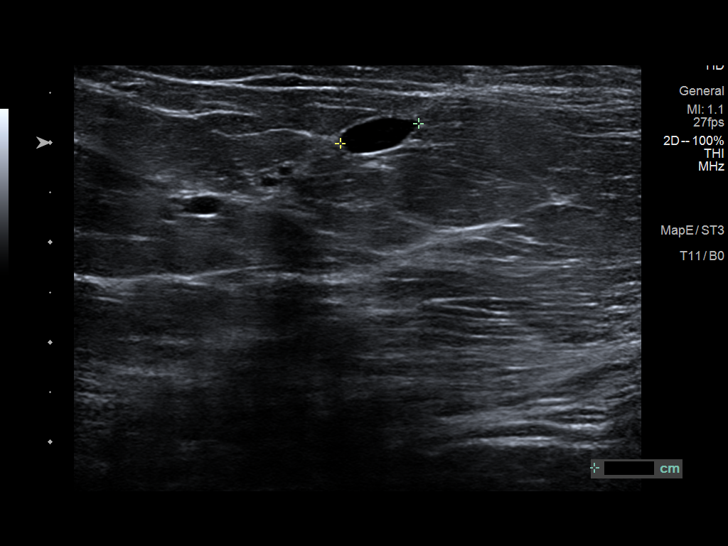
[im 8/22]
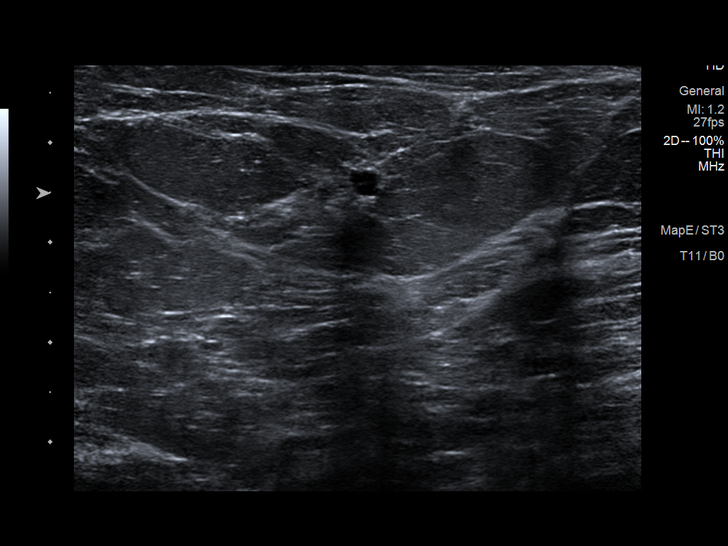
[im 10/22]
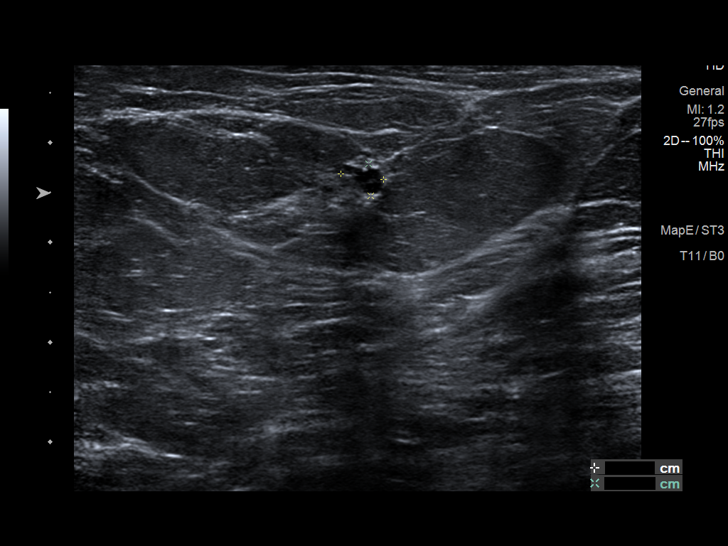
[im 12/22]
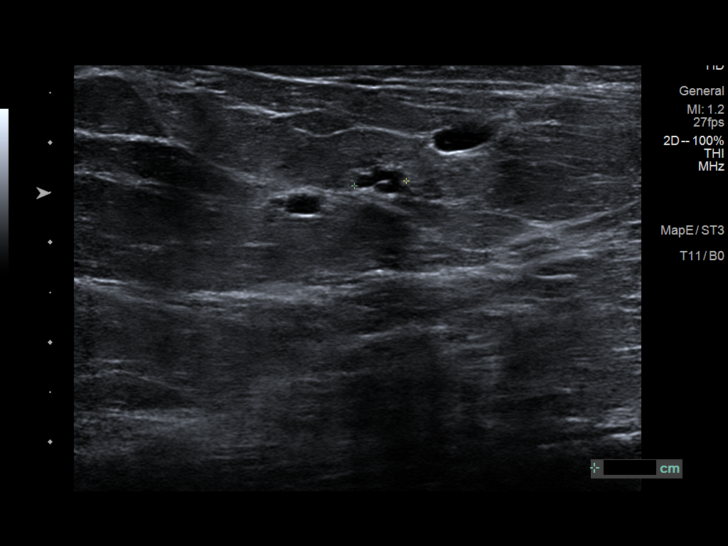
[im 13/22]
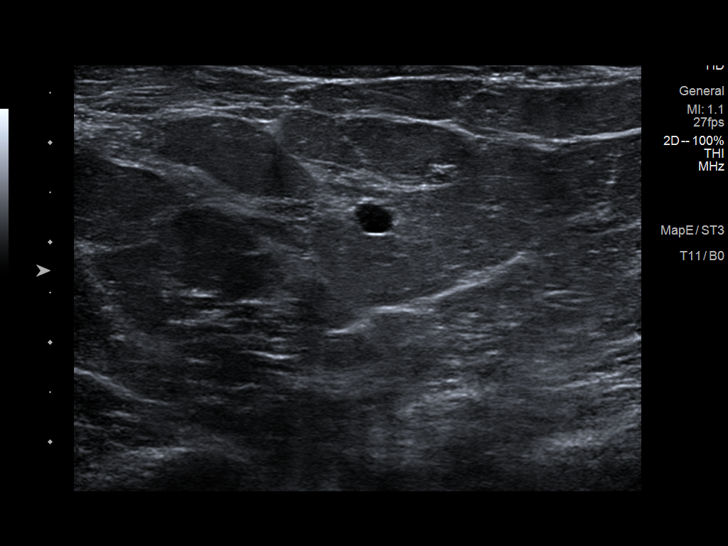
[im 15/22]
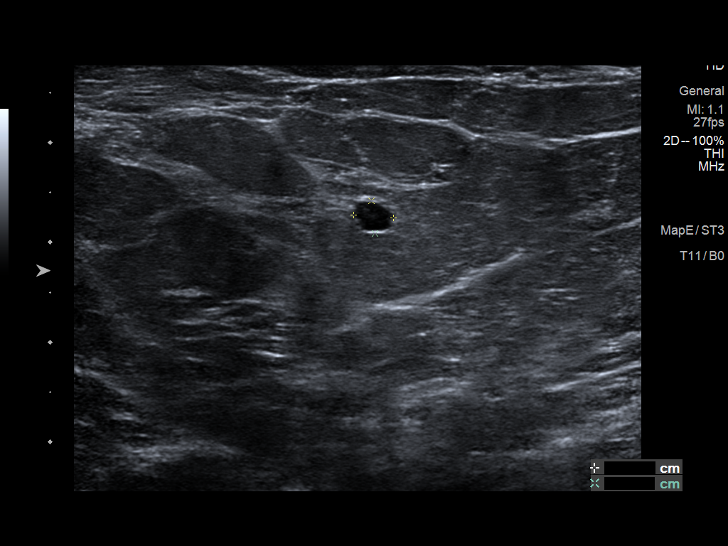
[im 17/22]
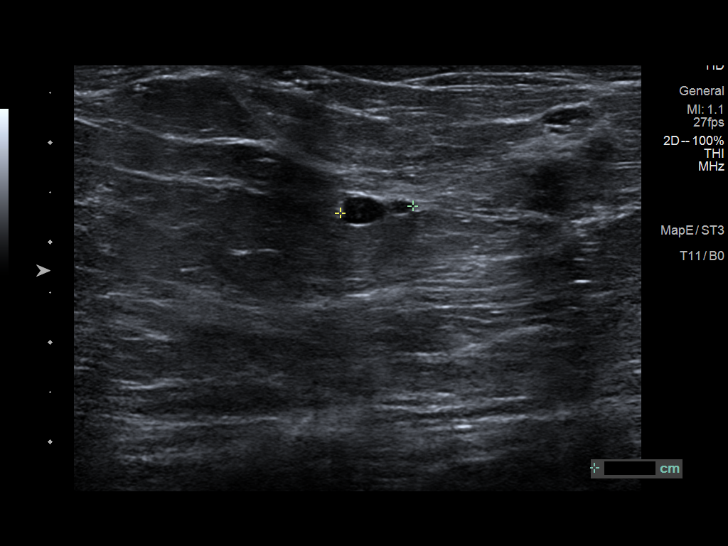
[im 18/22]
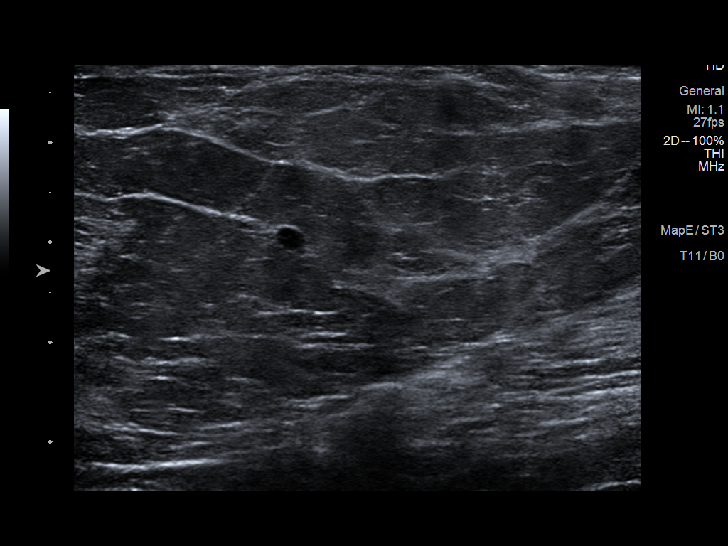
[im 20/22]
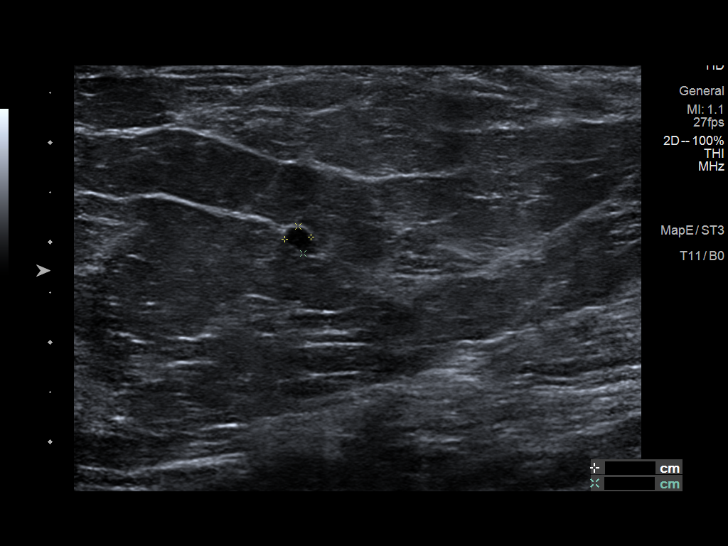
[im 22/22]
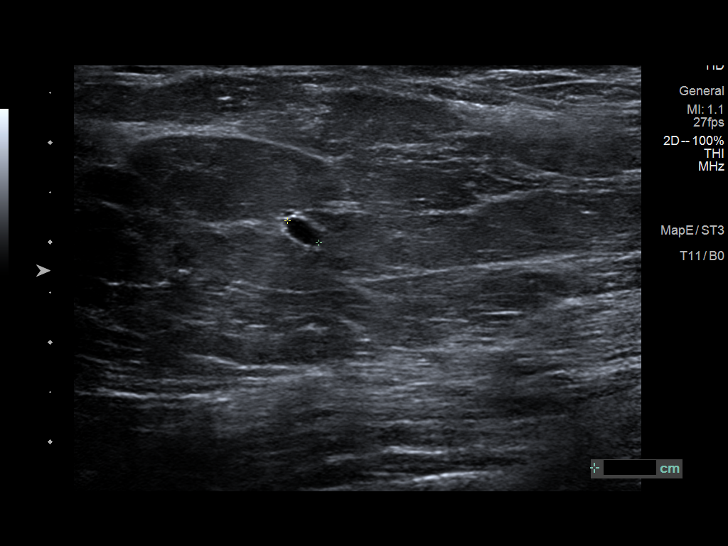

[13 of 22 positions shown; findings below may reference images not displayed]

ACR Breast Density Category b: There are scattered areas of
fibroglandular density.
FINDINGS: Within the superior slightly medial right breast middle depth there
are 2 adjacent oval circumscribed masses, further evaluated with
additional imaging.

Targeted ultrasound is performed, showing adjacent cysts within the
right breast 1 o'clock position 7, 6, 5 and 2 cm from the nipple.
The largest cyst measures 9 x 3 x 9 mm.
IMPRESSION: Multiple cysts within the right breast. No mammographic evidence for
malignancy.

RECOMMENDATION:
Screening mammogram in one year.(Code:IA-W-WL3)

I have discussed the findings and recommendations with the patient.
If applicable, a reminder letter will be sent to the patient
regarding the next appointment.

BI-RADS CATEGORY  2: Benign.

## 2022-09-17 IMAGING — MG MM DIGITAL DIAGNOSTIC UNILAT*R* W/ TOMO W/ CAD
4 series · 4 of 12 positions shown · non-contrast
Comparison: Previous exam(s).

CLINICAL DATA: Patient recalled from screening for right breast
masses.

EXAM:
DIGITAL DIAGNOSTIC UNILATERAL RIGHT MAMMOGRAM WITH TOMOSYNTHESIS AND
CAD; ULTRASOUND RIGHT BREAST LIMITED
TECHNIQUE: Right digital diagnostic mammography and breast tomosynthesis was
performed. The images were evaluated with computer-aided detection.;
Targeted ultrasound examination of the right breast was performed

[R CC synth-2D]
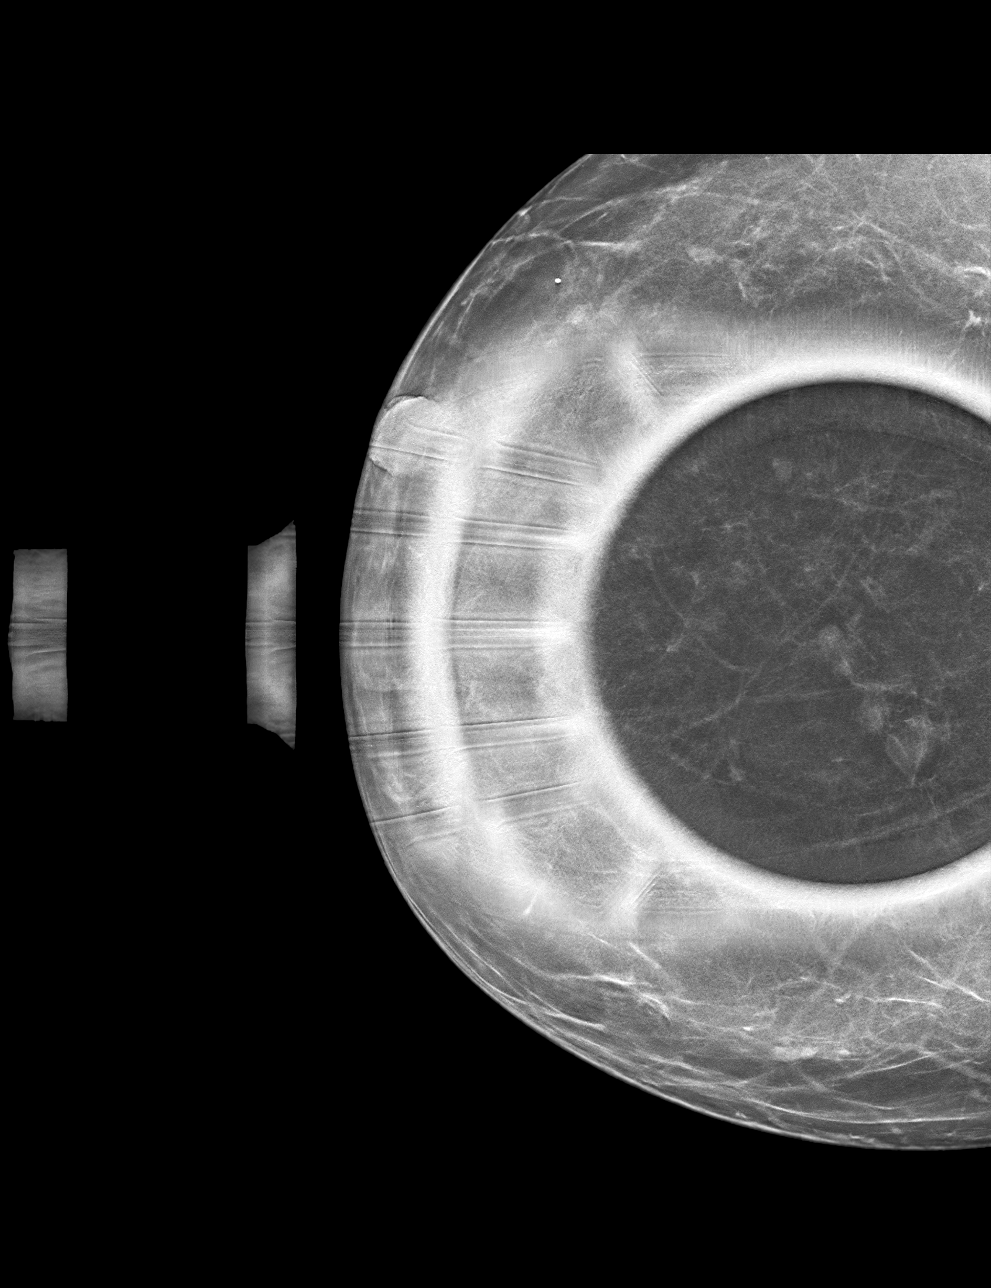

[R MLO synth-2D]
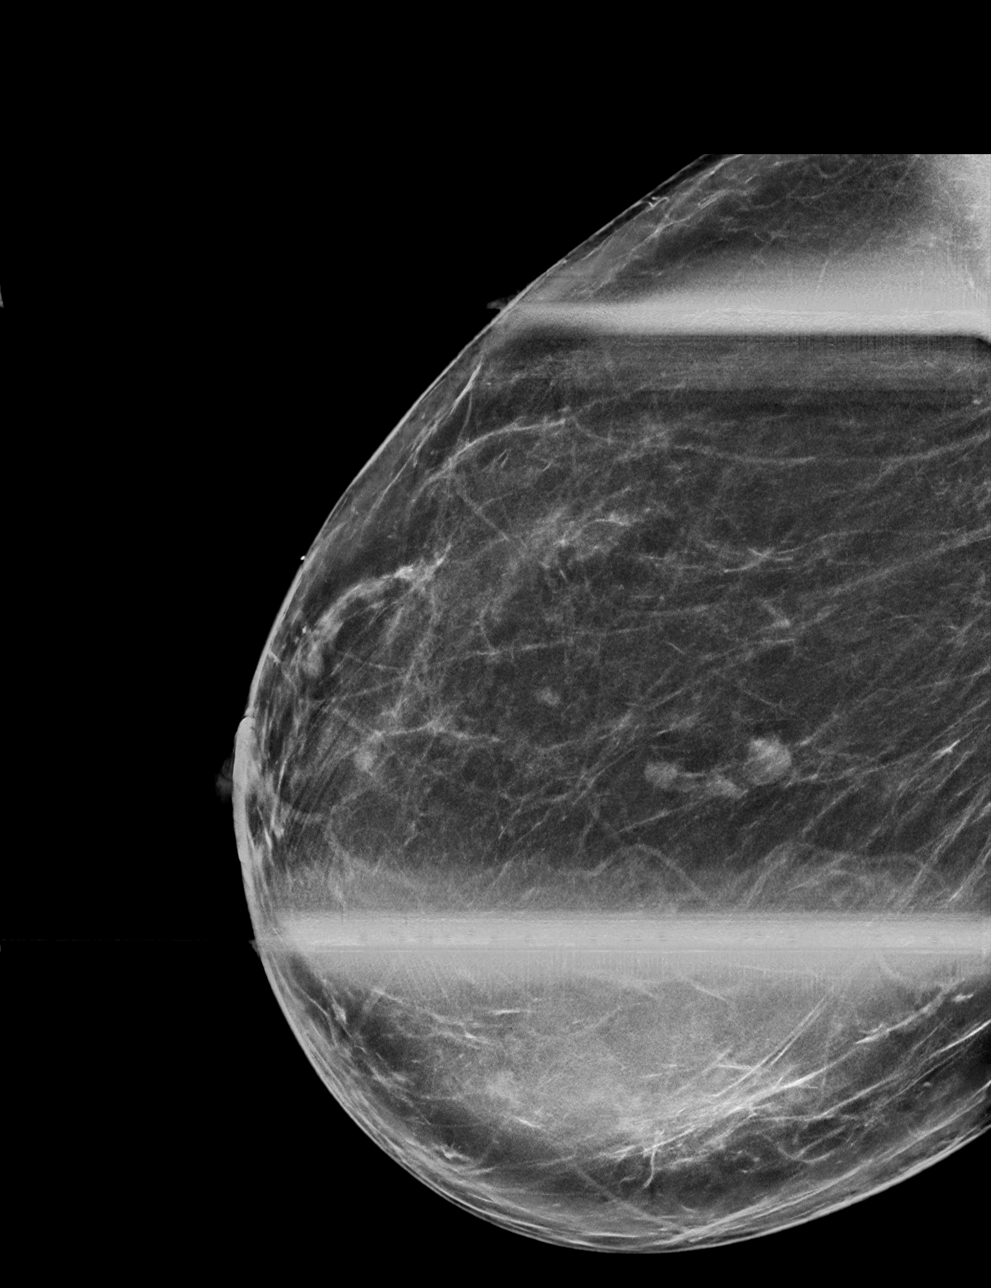

[R MLO tomo · tomo slice 35/68.0]
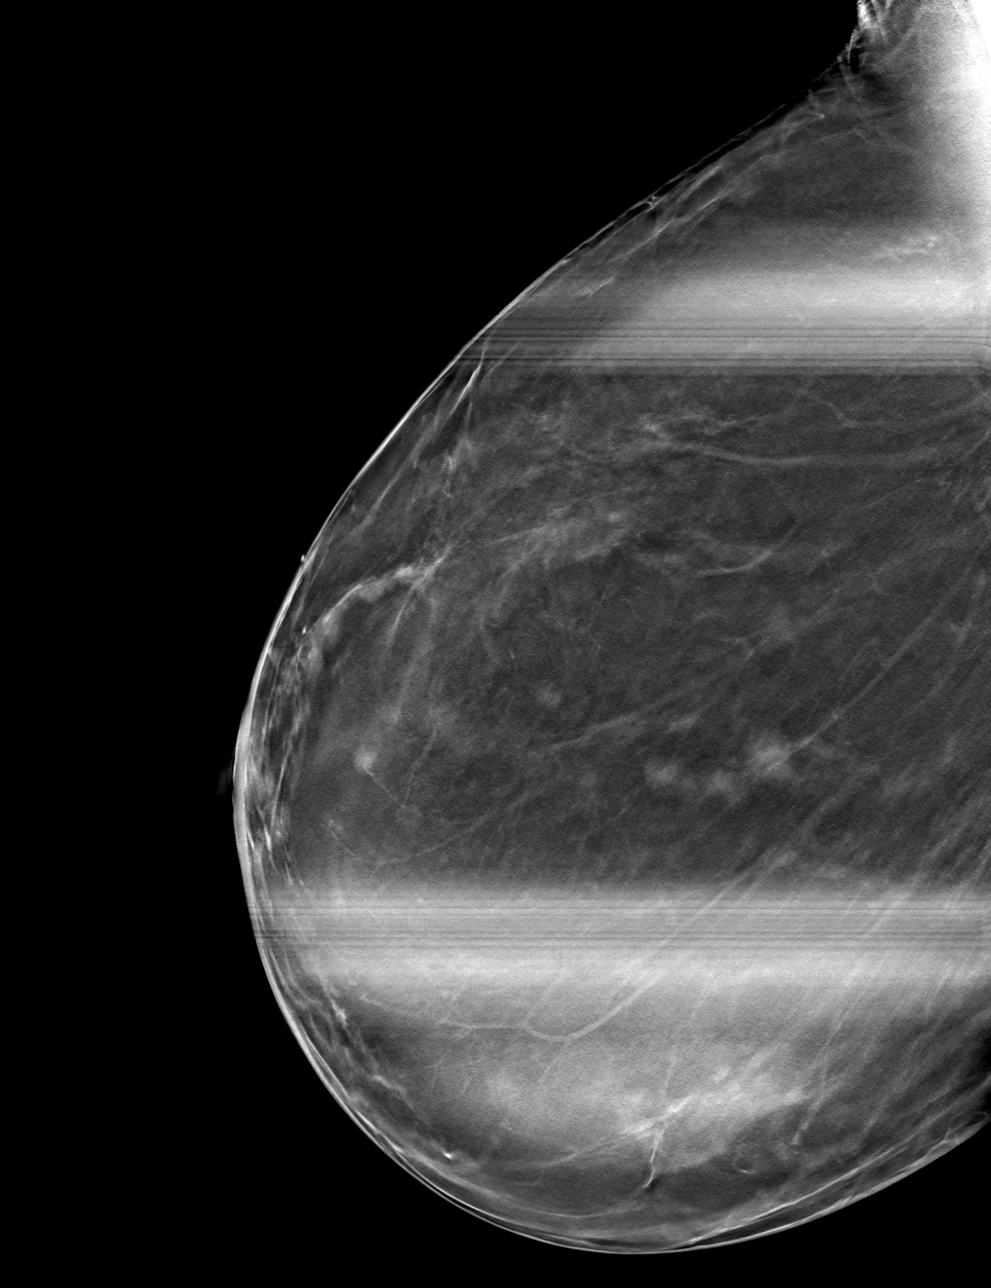

[R CC tomo · tomo slice 29/56.0]
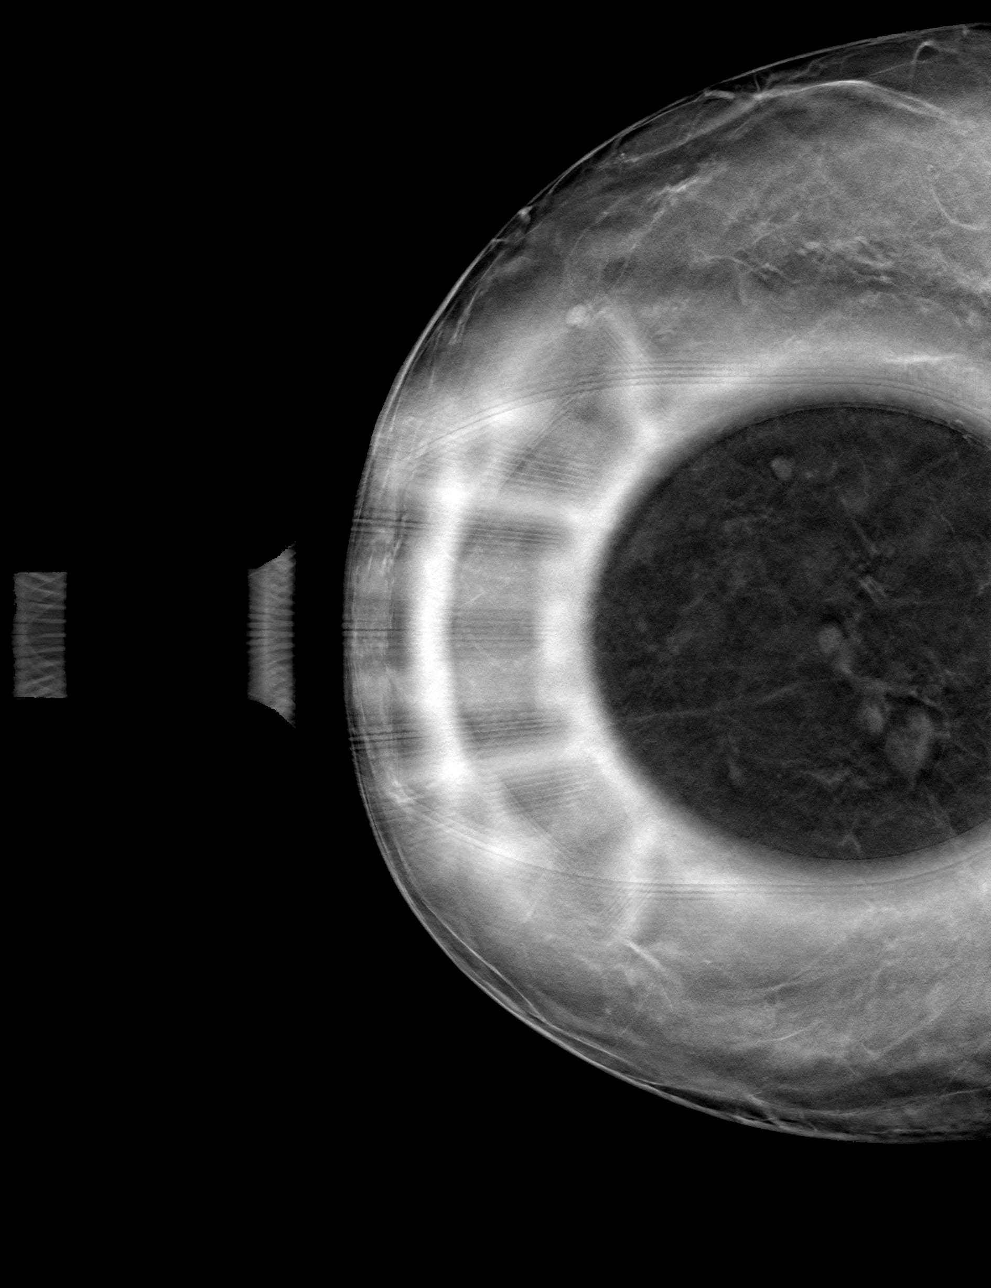

[4 of 12 positions shown; findings below may reference images not displayed]

ACR Breast Density Category b: There are scattered areas of
fibroglandular density.
FINDINGS: Within the superior slightly medial right breast middle depth there
are 2 adjacent oval circumscribed masses, further evaluated with
additional imaging.

Targeted ultrasound is performed, showing adjacent cysts within the
right breast 1 o'clock position 7, 6, 5 and 2 cm from the nipple.
The largest cyst measures 9 x 3 x 9 mm.
IMPRESSION: Multiple cysts within the right breast. No mammographic evidence for
malignancy.

RECOMMENDATION:
Screening mammogram in one year.(Code:IA-W-WL3)

I have discussed the findings and recommendations with the patient.
If applicable, a reminder letter will be sent to the patient
regarding the next appointment.

BI-RADS CATEGORY  2: Benign.

## 2022-11-05 ENCOUNTER — Ambulatory Visit
Admission: RE | Admit: 2022-11-05 | Discharge: 2022-11-05 | Disposition: A | Discharge status: Home / Self Care | Payer: Medicaid Other | Source: Ambulatory Visit | Attending: Family Medicine | Admitting: Family Medicine

## 2022-11-05 DIAGNOSIS — Z1231 Encounter for screening mammogram for malignant neoplasm of breast: Secondary | ICD-10-CM

## 2022-11-09 ENCOUNTER — Other Ambulatory Visit: Payer: Self-pay | Admitting: Family Medicine

## 2022-11-09 DIAGNOSIS — R928 Other abnormal and inconclusive findings on diagnostic imaging of breast: Secondary | ICD-10-CM

## 2022-11-23 ENCOUNTER — Ambulatory Visit
Admission: RE | Admit: 2022-11-23 | Discharge: 2022-11-23 | Disposition: A | Discharge status: Home / Self Care | Payer: Self-pay | Source: Ambulatory Visit | Attending: Family Medicine | Admitting: Family Medicine

## 2022-11-23 ENCOUNTER — Ambulatory Visit
Admission: RE | Admit: 2022-11-23 | Discharge: 2022-11-23 | Disposition: A | Discharge status: Home / Self Care | Payer: Medicaid Other | Source: Ambulatory Visit | Attending: Family Medicine | Admitting: Family Medicine

## 2022-11-23 ENCOUNTER — Other Ambulatory Visit: Payer: Self-pay | Admitting: Family Medicine

## 2022-11-23 DIAGNOSIS — N6320 Unspecified lump in the left breast, unspecified quadrant: Secondary | ICD-10-CM

## 2022-11-23 DIAGNOSIS — R928 Other abnormal and inconclusive findings on diagnostic imaging of breast: Secondary | ICD-10-CM

## 2022-11-25 ENCOUNTER — Ambulatory Visit
Admission: RE | Admit: 2022-11-25 | Discharge: 2022-11-25 | Disposition: A | Discharge status: Home / Self Care | Payer: Medicaid Other | Source: Ambulatory Visit | Attending: Family Medicine | Admitting: Family Medicine

## 2022-11-25 DIAGNOSIS — N6320 Unspecified lump in the left breast, unspecified quadrant: Secondary | ICD-10-CM

## 2022-11-25 DIAGNOSIS — R928 Other abnormal and inconclusive findings on diagnostic imaging of breast: Secondary | ICD-10-CM

## 2022-11-25 DIAGNOSIS — N6321 Unspecified lump in the left breast, upper outer quadrant: Secondary | ICD-10-CM

## 2022-11-25 HISTORY — PX: BREAST BIOPSY: SHX20

## 2022-12-01 ENCOUNTER — Other Ambulatory Visit: Payer: Self-pay

## 2023-02-24 ENCOUNTER — Other Ambulatory Visit: Payer: Self-pay | Admitting: Geriatric Medicine

## 2023-02-24 DIAGNOSIS — K56609 Unspecified intestinal obstruction, unspecified as to partial versus complete obstruction: Secondary | ICD-10-CM

## 2023-03-16 ENCOUNTER — Encounter: Payer: Self-pay | Admitting: Geriatric Medicine

## 2023-04-01 ENCOUNTER — Encounter: Payer: Self-pay | Admitting: Geriatric Medicine

## 2023-04-02 ENCOUNTER — Ambulatory Visit: Payer: Medicaid Other

## 2023-04-05 ENCOUNTER — Ambulatory Visit: Payer: Medicaid Other

## 2023-04-29 ENCOUNTER — Encounter: Payer: Self-pay | Admitting: Geriatric Medicine

## 2023-05-04 ENCOUNTER — Ambulatory Visit: Admission: RE | Admit: 2023-05-04 | Payer: Medicaid Other | Source: Ambulatory Visit

## 2023-05-04 DIAGNOSIS — K56609 Unspecified intestinal obstruction, unspecified as to partial versus complete obstruction: Secondary | ICD-10-CM

## 2023-12-28 ENCOUNTER — Other Ambulatory Visit: Payer: Self-pay | Admitting: Family Medicine

## 2023-12-28 DIAGNOSIS — Z1231 Encounter for screening mammogram for malignant neoplasm of breast: Secondary | ICD-10-CM

## 2024-01-05 ENCOUNTER — Ambulatory Visit
Admission: RE | Admit: 2024-01-05 | Discharge: 2024-01-05 | Disposition: A | Discharge status: Home / Self Care | Source: Ambulatory Visit | Attending: Family Medicine | Admitting: Family Medicine

## 2024-01-05 DIAGNOSIS — Z1231 Encounter for screening mammogram for malignant neoplasm of breast: Secondary | ICD-10-CM

## 2024-01-07 ENCOUNTER — Other Ambulatory Visit: Payer: Self-pay | Admitting: Family Medicine

## 2024-01-07 DIAGNOSIS — R928 Other abnormal and inconclusive findings on diagnostic imaging of breast: Secondary | ICD-10-CM

## 2024-01-14 ENCOUNTER — Other Ambulatory Visit

## 2024-01-14 ENCOUNTER — Encounter

## 2024-01-17 ENCOUNTER — Ambulatory Visit
Admission: RE | Admit: 2024-01-17 | Discharge: 2024-01-17 | Disposition: A | Discharge status: Home / Self Care | Source: Ambulatory Visit | Attending: Family Medicine | Admitting: Family Medicine

## 2024-01-17 ENCOUNTER — Ambulatory Visit

## 2024-01-17 DIAGNOSIS — R928 Other abnormal and inconclusive findings on diagnostic imaging of breast: Secondary | ICD-10-CM
